# Patient Record
Sex: Male | Born: 1940 | Race: White | Hispanic: No | Marital: Married | Smoking: Former smoker
Health system: Southern US, Community
[De-identification: ages and names within clinical notes are randomized; demographics above are authoritative.]

## PROBLEM LIST (undated history)

## (undated) DIAGNOSIS — I10 Essential (primary) hypertension: Secondary | ICD-10-CM

## (undated) DIAGNOSIS — A159 Respiratory tuberculosis unspecified: Secondary | ICD-10-CM

## (undated) DIAGNOSIS — E119 Type 2 diabetes mellitus without complications: Secondary | ICD-10-CM

## (undated) DIAGNOSIS — H409 Unspecified glaucoma: Secondary | ICD-10-CM

## (undated) DIAGNOSIS — T7840XA Allergy, unspecified, initial encounter: Secondary | ICD-10-CM

## (undated) DIAGNOSIS — E785 Hyperlipidemia, unspecified: Secondary | ICD-10-CM

## (undated) DIAGNOSIS — C801 Malignant (primary) neoplasm, unspecified: Secondary | ICD-10-CM

## (undated) HISTORY — DX: Unspecified glaucoma: H40.9

## (undated) HISTORY — PX: SKIN CANCER EXCISION: SHX779

## (undated) HISTORY — DX: Type 2 diabetes mellitus without complications: E11.9

## (undated) HISTORY — PX: HYDROCELE EXCISION: SHX482

## (undated) HISTORY — DX: Respiratory tuberculosis unspecified: A15.9

## (undated) HISTORY — DX: Hyperlipidemia, unspecified: E78.5

## (undated) HISTORY — PX: POLYPECTOMY: SHX149

## (undated) HISTORY — DX: Malignant (primary) neoplasm, unspecified: C80.1

## (undated) HISTORY — DX: Essential (primary) hypertension: I10

## (undated) HISTORY — DX: Allergy, unspecified, initial encounter: T78.40XA

## (undated) HISTORY — PX: CATARACT EXTRACTION: SUR2

---

## 1998-05-14 ENCOUNTER — Encounter: Admission: RE | Admit: 1998-05-14 | Discharge: 1998-08-12 | Payer: Self-pay | Admitting: Internal Medicine

## 2000-04-18 ENCOUNTER — Inpatient Hospital Stay (HOSPITAL_COMMUNITY): Admission: EM | Admit: 2000-04-18 | Discharge: 2000-04-19 | Payer: Self-pay | Admitting: Internal Medicine

## 2008-07-24 ENCOUNTER — Ambulatory Visit (HOSPITAL_BASED_OUTPATIENT_CLINIC_OR_DEPARTMENT_OTHER): Admission: RE | Admit: 2008-07-24 | Discharge: 2008-07-24 | Payer: Self-pay | Admitting: Urology

## 2008-11-07 ENCOUNTER — Ambulatory Visit: Payer: Self-pay | Admitting: Internal Medicine

## 2008-11-21 ENCOUNTER — Ambulatory Visit: Payer: Self-pay | Admitting: Internal Medicine

## 2008-11-28 ENCOUNTER — Encounter: Payer: Self-pay | Admitting: Internal Medicine

## 2011-01-14 LAB — GLUCOSE, CAPILLARY
Glucose-Capillary: 126 mg/dL — ABNORMAL HIGH (ref 70–99)
Glucose-Capillary: 175 mg/dL — ABNORMAL HIGH (ref 70–99)

## 2011-02-11 NOTE — Op Note (Signed)
NAMEOSCAR, HANK NO.:  [id number]   MEDICAL RECORD NO.:  [id number]          PATIENT TYPE:  AMB   LOCATION:  [facility]                         FACILITY:  [facility]   PHYSICIAN:  Bertram Millard. Dahlstedt, M.D.DATE OF BIRTH:  1941-05-12   DATE OF PROCEDURE:  07/24/2008  DATE OF DISCHARGE:                               OPERATIVE REPORT   PREOPERATIVE DIAGNOSIS:  Symptomatic left hydrocele.   POSTOPERATIVE DIAGNOSIS:  Symptomatic left hydrocele.   SURGICAL PROCEDURES:  Left hydrocelectomy.   ANESTHESIA:  General with LMA.   COMPLICATIONS:  None.   DRAINS:  None.   BRIEF HISTORY:  This 70 year old male recently re-presented with a  symptomatic left hydrocele.  He was seen in the past.  As the hydrocele  was not that large or symptomatic, I recommended that we follow him  conservatively.  It has become more uncomfortable with time, and the  patient desires surgical excision.  We did discuss a temporizing  aspiration, but I discussed the fact that almost always the fluid  reaccumulates shortly after its aspiration.  He was instructed in the  surgery as well as risks and complications.  He desires to proceed.   DESCRIPTION OF PROCEDURE:  The patient was administered preoperative IV  antibiotics.  He was identified in the holding area and the site of  surgery was carefully marked.  He was taken to the operating room where  general anesthetic was administered using the LMA.  He was placed in the  recumbent position.  Genitalia and perineum were prepped and draped.   A 4-cm  incision was made in the anterior mid median raphe of the  scrotum.  Careful dissection carried [place] down to the tunica vaginalis  which was circumferentially dissected and was pushed through the  incision.  Small bleeders in the scrotum and along the tunica were  carefully electrocoagulated.  The tunica vaginalis was opened in the  midline, and the hydrocele fluid was aspirated.  The entire anterior  edge of the tunica vaginalis was opened.  It was then imbricated  posteriorly with a running 3-0 chromic.  Edges of the cut tunica were  carefully electrocoagulated.  Once the imbrication posteriorly was  complete, the cord was blocked with approximately 5 mL of quarter  percent plain Marcaine.  The inner surface of the scrotum was carefully  inspected and found to be hemostatic.  The testicle and cord were  replaced within this.   The dartos fascia was reapproximated using a running 3-0 chromic.  Skin  edges were approximated using a running 4-0 Vicryl placed in a simple  fashion.  Dry sterile dressings were placed underneath a jock strap.  The patient was awakened and then taken to PACU in stable condition.  He  tolerated procedure well.      Bertram Millard. Dahlstedt, M.D.  Electronically Signed     SMD/MEDQ  D:  07/24/2008  T:  07/24/2008  Job:  [id number]   cc:   Larina Earthly, M.D.  Fax: [phone]

## 2011-02-14 NOTE — Discharge Summary (Signed)
[place]  Patient:    Chad Oneill, Chad Oneill                  MRN: [id number] Adm. Date:  [id number] Disc. Date: [id number] Attending:  Chilton Greathouse R                           Discharge Summary  FINAL DIAGNOSES: 1. Anaphylactic shock. 2. Diabetes mellitus, type 2. 3. Hypertension.  HISTORY OF PRESENT ILLNESS:  Chad Oneill is a 70 year old white male with diabetes admitted after being stung by yellow jackets. After the stings, he became light headed and then had what appeared to a neighbor to be seizure like activity. When EMS arrived, his blood pressure was 50/palpable and his blood pressure was 59 mg per dl.  In the field, he received epinephrine and IV fluids. In the emergency room, he got additional Benadryl as well as Pepcid, as well as Solu-Medrol IV.  By the time, I saw him, he was looking fairly good.  HOSPITAL COURSE:  The patient was admitted and observed overnight.  Blood pressure increased to on the day of discharge 144/99. He had some high blood sugars after the Solu-Medrol but on the morning of discharge his blood pressure was 173.  He felt well enough to be discharged at this time.  CONDITION ON DISCHARGE:  Improved.  DISCHARGE INSTRUCTIONS:  The patient was to go home on his usual medications of aspirin, Cardizem CD, Hydrochlorothiazide/Triameterne.  I told him to stop his Accupril for the moment, to restart some self monitoring, and then reinitiate the Accupril if his blood pressures are consistently greater than 130.  Provided him with a prescription for EpiPen to use in emergencies for similar stings.  Also provided him a prescription for Prednisone 10 mg three per day times two days, two per day times two days, one per day times two days and then to stop.  Activity was as tolerated. He was instructed to avoid sweets and told that the prednisone may increase his blood sugars so he should try to eat less than usual. DD:   04/19/00 TD:  04/20/00 Job: [id number]

## 2011-07-01 LAB — POCT I-STAT 4, (NA,K, GLUC, HGB,HCT)
Potassium: 3.8
Sodium: 139

## 2011-07-01 LAB — GLUCOSE, CAPILLARY: Glucose-Capillary: 129 — ABNORMAL HIGH

## 2013-02-15 ENCOUNTER — Other Ambulatory Visit: Payer: Self-pay | Admitting: Surgery

## 2013-11-07 ENCOUNTER — Encounter: Payer: Self-pay | Admitting: Internal Medicine

## 2013-12-08 ENCOUNTER — Encounter: Payer: Self-pay | Admitting: Internal Medicine

## 2014-01-17 ENCOUNTER — Ambulatory Visit (AMBULATORY_SURGERY_CENTER): Payer: Self-pay | Admitting: *Deleted

## 2014-01-17 VITALS — Ht 73.0 in | Wt 270.6 lb

## 2014-01-17 DIAGNOSIS — Z860100 Personal history of colon polyps, unspecified: Secondary | ICD-10-CM

## 2014-01-17 MED ORDER — MOVIPREP 100 G PO SOLR: ORAL | Status: DC

## 2014-01-17 NOTE — Progress Notes (Signed)
No egg or soy allergy  No home oxygen use or problems with anesthesia  No medications for weight loss taken  emmi information given 

## 2014-01-20 ENCOUNTER — Encounter: Payer: Self-pay | Admitting: Internal Medicine

## 2014-01-31 ENCOUNTER — Ambulatory Visit (AMBULATORY_SURGERY_CENTER): Payer: Medicare Other | Admitting: Internal Medicine

## 2014-01-31 ENCOUNTER — Encounter: Payer: Self-pay | Admitting: Internal Medicine

## 2014-01-31 VITALS — BP 134/96 | HR 63 | Temp 96.8°F | Resp 11 | Ht 73.0 in | Wt 270.0 lb

## 2014-01-31 DIAGNOSIS — Z860100 Personal history of colon polyps, unspecified: Secondary | ICD-10-CM

## 2014-01-31 DIAGNOSIS — D126 Benign neoplasm of colon, unspecified: Secondary | ICD-10-CM

## 2014-01-31 HISTORY — PX: COLONOSCOPY: SHX174

## 2014-01-31 LAB — GLUCOSE, CAPILLARY
GLUCOSE-CAPILLARY: 144 mg/dL — AB (ref 70–99)
Glucose-Capillary: 125 mg/dL — ABNORMAL HIGH (ref 70–99)

## 2014-01-31 MED ORDER — SODIUM CHLORIDE 0.9 % IV SOLN: 500.0000 mL | INTRAVENOUS | Status: DC

## 2014-01-31 NOTE — Progress Notes (Signed)
Report to pacu rn, vss, bbs=clear 

## 2014-01-31 NOTE — Patient Instructions (Signed)
YOU HAD AN ENDOSCOPIC PROCEDURE TODAY AT THE [facility]: Refer to the procedure report that was given to you for any specific questions about what was found during the examination.  If the procedure report does not answer your questions, please call your gastroenterologist to clarify.  If you requested that your care partner not be given the details of your procedure findings, then the procedure report has been included in a sealed envelope for you to review at your convenience later.  YOU SHOULD EXPECT: Some feelings of bloating in the abdomen. Passage of more gas than usual.  Walking can help get rid of the air that was put into your GI tract during the procedure and reduce the bloating. If you had a lower endoscopy (such as a colonoscopy or flexible sigmoidoscopy) you may notice spotting of blood in your stool or on the toilet paper. If you underwent a bowel prep for your procedure, then you may not have a normal bowel movement for a few days.  DIET: Your first meal following the procedure should be a light meal and then it is ok to progress to your normal diet.  A half-sandwich or bowl of soup is an example of a good first meal.  Heavy or fried foods are harder to digest and may make you feel nauseous or bloated.  Likewise meals heavy in dairy and vegetables can cause extra gas to form and this can also increase the bloating.  Drink plenty of fluids but you should avoid alcoholic beverages for 24 hours.  ACTIVITY: Your care partner should take you home directly after the procedure.  You should plan to take it easy, moving slowly for the rest of the day.  You can resume normal activity the day after the procedure however you should NOT DRIVE or use heavy machinery for 24 hours (because of the sedation medicines used during the test).    SYMPTOMS TO REPORT IMMEDIATELY: A gastroenterologist can be reached at any hour.  During normal business hours, 8:30 AM to 5:00 PM Monday through Friday,  call [phone].  After hours and on weekends, please call the GI answering service at [phone] who will take a message and have the physician on call contact you.   Following lower endoscopy (colonoscopy or flexible sigmoidoscopy):  Excessive amounts of blood in the stool  Significant tenderness or worsening of abdominal pains  Swelling of the abdomen that is new, acute  Fever of 100F or higher    FOLLOW UP: If any biopsies were taken you will be contacted by phone or by letter within the next 1-3 weeks.  Call your gastroenterologist if you have not heard about the biopsies in 3 weeks.  Our staff will call the home number listed on your records the next business day following your procedure to check on you and address any questions or concerns that you may have at that time regarding the information given to you following your procedure. This is a courtesy call and so if there is no answer at the home number and we have not heard from you through the emergency physician on call, we will assume that you have returned to your regular daily activities without incident.  SIGNATURES/CONFIDENTIALITY: You and/or your care partner have signed paperwork which will be entered into your electronic medical record.  These signatures attest to the fact that that the information above on your After Visit Summary has been reviewed and is understood.  Full responsibility of the confidentiality   of this discharge information lies with you and/or your care-partner.     

## 2014-01-31 NOTE — Op Note (Signed)
[place]  [employer]. [place], [address]   COLONOSCOPY PROCEDURE REPORT  PATIENT: Chad Oneill, Chad Oneill  MR#: [id number] BIRTHDATE: 05-31-1941 , 62  yrs. old GENDER: Male ENDOSCOPIST: Eustace Quail, MD REFERRED KD:[id number] Program Recall PROCEDURE DATE:  01/31/2014 PROCEDURE:   Colonoscopy with snare polypectomy x 4 First Screening Colonoscopy - Avg.  risk and is 50 yrs.  old or older - No.  Prior Negative Screening - Now for repeat screening. N/A  History of Adenoma - Now for follow-up colonoscopy & has been > or = to 3 yrs.  Yes hx of adenoma.  Has been 3 or more years since last colonoscopy.  Polyps Removed Today? Yes. ASA CLASS:   Class II INDICATIONS:Patient's personal history of adenomatous colon polyps. Index 2010 with several < 1cm adenomas MEDICATIONS: MAC sedation, administered by CRNA and propofol (Diprivan) 250mg  IV  DESCRIPTION OF PROCEDURE:   After the risks benefits and alternatives of the procedure were thoroughly explained, informed consent was obtained.  A digital rectal exam revealed no abnormalities of the rectum.   The LB [id number]-[id number]  endoscope was introduced through the anus and advanced to the cecum, which was identified by both the appendix and ileocecal valve. No adverse events experienced.   The quality of the prep was good, using MoviPrep  The instrument was then slowly withdrawn as the colon was fully examined.  COLON FINDINGS: Four polyps ranging between 3-49mm in size were found in the transverse and sigmoid colon.  A polypectomy was performed with a cold snare.  The resection was complete and the polyps were completely retrieved.   Severe diverticulosis was noted The finding was in the left colon.   The colon mucosa was otherwise normal. Retroflexed views revealed internal hemorrhoids. The time to cecum=2 minutes 19 seconds.  Withdrawal time=13 minutes 27 seconds. The scope was withdrawn and the procedure  completed. COMPLICATIONS: There were no complications.  ENDOSCOPIC IMPRESSION: 1.   Four polyps  were found in the transverse colon and sigmoid colon; polypectomy was performed with a cold snare 2.   Severe diverticulosis was noted in the left colon 3.   The colon mucosa was otherwise normal  RECOMMENDATIONS: 1. Follow up colonoscopy in 5 years   eSigned:  Eustace Quail, MD 01/31/2014 9:20 AM   cc: Prince Solian, MD and The Patient

## 2014-01-31 NOTE — Progress Notes (Signed)
Called to room to assist during endoscopic procedure.  Patient ID and intended procedure confirmed with present staff. Received instructions for my participation in the procedure from the performing physician.  

## 2014-02-01 ENCOUNTER — Telehealth: Payer: Self-pay | Admitting: *Deleted

## 2014-02-01 NOTE — Telephone Encounter (Signed)
  Follow up Call-  Call back number 01/31/2014  Post procedure Call Back phone  # [phone]  Permission to leave phone message Yes     Patient questions:  Do you have a fever, pain , or abdominal swelling? no Pain Score  0 *  Have you tolerated food without any problems? yes  Have you been able to return to your normal activities? yes  Do you have any questions about your discharge instructions: Diet   no Medications  no Follow up visit  no  Do you have questions or concerns about your Care? no  Actions: * If pain score is 4 or above: No action needed, pain <4.

## 2014-02-07 ENCOUNTER — Encounter: Payer: Self-pay | Admitting: Internal Medicine

## 2014-06-07 ENCOUNTER — Other Ambulatory Visit (HOSPITAL_COMMUNITY): Payer: Self-pay | Admitting: Internal Medicine

## 2014-06-07 DIAGNOSIS — I959 Hypotension, unspecified: Secondary | ICD-10-CM

## 2014-06-08 ENCOUNTER — Ambulatory Visit (HOSPITAL_COMMUNITY): Payer: Medicare Other

## 2014-06-13 ENCOUNTER — Ambulatory Visit (HOSPITAL_COMMUNITY): Payer: Medicare Other

## 2014-06-14 ENCOUNTER — Ambulatory Visit (HOSPITAL_COMMUNITY)
Admission: RE | Admit: 2014-06-14 | Discharge: 2014-06-14 | Disposition: A | Discharge status: Home / Self Care | Payer: Medicare Other | Source: Ambulatory Visit | Attending: Cardiovascular Disease | Admitting: Cardiovascular Disease

## 2014-06-14 DIAGNOSIS — I369 Nonrheumatic tricuspid valve disorder, unspecified: Secondary | ICD-10-CM

## 2014-06-14 DIAGNOSIS — E119 Type 2 diabetes mellitus without complications: Secondary | ICD-10-CM | POA: Insufficient documentation

## 2014-06-14 DIAGNOSIS — I959 Hypotension, unspecified: Secondary | ICD-10-CM

## 2014-06-14 DIAGNOSIS — I951 Orthostatic hypotension: Secondary | ICD-10-CM | POA: Insufficient documentation

## 2014-06-14 NOTE — Progress Notes (Signed)
2D Echo Performed 06/14/2014    Chad Oneill, RCS

## 2015-08-15 ENCOUNTER — Encounter: Payer: Self-pay | Admitting: Internal Medicine

## 2017-08-31 ENCOUNTER — Emergency Department (HOSPITAL_COMMUNITY)
Admission: EM | Admit: 2017-08-31 | Discharge: 2017-08-31 | Disposition: A | Discharge status: Home / Self Care | Payer: Medicare Other | Attending: Emergency Medicine | Admitting: Emergency Medicine

## 2017-08-31 ENCOUNTER — Emergency Department (HOSPITAL_COMMUNITY): Payer: Medicare Other

## 2017-08-31 ENCOUNTER — Other Ambulatory Visit: Payer: Self-pay

## 2017-08-31 ENCOUNTER — Encounter (HOSPITAL_COMMUNITY): Payer: Self-pay | Admitting: Emergency Medicine

## 2017-08-31 DIAGNOSIS — R072 Precordial pain: Secondary | ICD-10-CM | POA: Insufficient documentation

## 2017-08-31 DIAGNOSIS — R079 Chest pain, unspecified: Secondary | ICD-10-CM

## 2017-08-31 DIAGNOSIS — E119 Type 2 diabetes mellitus without complications: Secondary | ICD-10-CM | POA: Diagnosis not present

## 2017-08-31 DIAGNOSIS — F17210 Nicotine dependence, cigarettes, uncomplicated: Secondary | ICD-10-CM | POA: Diagnosis not present

## 2017-08-31 DIAGNOSIS — Z7984 Long term (current) use of oral hypoglycemic drugs: Secondary | ICD-10-CM | POA: Diagnosis not present

## 2017-08-31 DIAGNOSIS — I10 Essential (primary) hypertension: Secondary | ICD-10-CM | POA: Insufficient documentation

## 2017-08-31 DIAGNOSIS — Z79899 Other long term (current) drug therapy: Secondary | ICD-10-CM | POA: Diagnosis not present

## 2017-08-31 DIAGNOSIS — Z7982 Long term (current) use of aspirin: Secondary | ICD-10-CM | POA: Diagnosis not present

## 2017-08-31 LAB — I-STAT TROPONIN, ED
TROPONIN I, POC: 0 ng/mL (ref 0.00–0.08)
Troponin i, poc: 0 ng/mL (ref 0.00–0.08)

## 2017-08-31 LAB — BASIC METABOLIC PANEL
ANION GAP: 10 (ref 5–15)
BUN: 9 mg/dL (ref 6–20)
CALCIUM: 8.8 mg/dL — AB (ref 8.9–10.3)
CHLORIDE: 101 mmol/L (ref 101–111)
CO2: 27 mmol/L (ref 22–32)
Creatinine, Ser: 0.84 mg/dL (ref 0.61–1.24)
GFR calc non Af Amer: >60 mL/min (ref >60)
GLUCOSE: 150 mg/dL — AB (ref 65–99)
POTASSIUM: 3.7 mmol/L (ref 3.5–5.1)
Sodium: 138 mmol/L (ref 135–145)

## 2017-08-31 LAB — CBC
HCT: 41 % (ref 39.0–52.0)
Hemoglobin: 13.8 g/dL (ref 13.0–17.0)
MCH: 31.4 pg (ref 26.0–34.0)
MCHC: 33.7 g/dL (ref 30.0–36.0)
MCV: 93.4 fL (ref 78.0–100.0)
PLATELETS: 225 10*3/uL (ref 150–400)
RBC: 4.39 MIL/uL (ref 4.22–5.81)
RDW: 12.9 % (ref 11.5–15.5)
WBC: 12.9 10*3/uL — AB (ref 4.0–10.5)

## 2017-08-31 MED ORDER — GI COCKTAIL ~~LOC~~
30.0000 mL | Freq: Once | ORAL | Status: AC
  Administered 2017-08-31: 30 mL via ORAL
  Filled 2017-08-31: qty 30

## 2017-08-31 NOTE — ED Notes (Signed)
Patient transported to X-ray 

## 2017-08-31 NOTE — ED Notes (Signed)
Patient and family given update. No complaints at this time.

## 2017-08-31 NOTE — ED Provider Notes (Signed)
[facility] EMERGENCY DEPARTMENT Provider Note   CSN: [id number] Arrival date & time: 08/31/17  0749     History   Chief Complaint Chief Complaint  Patient presents with  . Chest Pain    HPI Chad Oneill is a 76 y.o. male who presents with chest pain. PMH significant for non-insulin dependent DM, HTN, HLD, A.fib (not on blood thinners), glaucoma. The patient states he was woken up acutely at ~4 AM this morning due to chest pain. He states it felt like heartburn so he drank baking soda and water. He states he has had heartburn in the past and it felt similar to this and he ate chinese take out last night. This helped the chest pain but he still had some residual discomfort over the substernal area. He states it feels like a cramping. He denies fever, chills, cough, SOB, wheezing, abdominal pain, N/V, diaphoresis. He took 2 ASA and received 1 nitro by EMS which did not help the pain. He denies cardiac history. He has had an echo in 2015 which showed mild LVH, grade 1 diastolic dysfunction with a normal EF. No recent surgery/travel/immobilization, leg swelling, hemptysis, prior DVT/PE, or hormone use. He does have a hx of melanoma but this is not currently active.  PCP: [facility]  HPI  Past Medical History:  Diagnosis Date  . Allergy   . Cancer ([place])    melanoma- back  . Diabetes mellitus without complication ([place])   . Glaucoma   . Hyperlipidemia   . Hypertension   . 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   Past Surgical History:  Procedure Laterality Date  . CATARACT EXTRACTION     both eyes  . HYDROCELE EXCISION    . SKIN CANCER EXCISION         Home Medications    Prior to Admission medications   Medication Sig Start Date End Date Taking? Authorizing Provider  aspirin EC 81 MG tablet Take 81 mg by mouth daily.    [provider]  atorvastatin  (LIPITOR) 10 MG tablet Take 10 mg by mouth daily.    [provider]  bimatoprost (LUMIGAN) 0.01 % SOLN Place 1 drop into both eyes at bedtime.    [provider]  diltiazem (TIAZAC) 300 MG 24 hr capsule Take 300 mg by mouth daily.    [provider]  doxazosin (CARDURA) 4 MG tablet Take 4 mg by mouth daily.    [provider]  exenatide (BYETTA) 10 MCG/0.04ML SOPN injection Inject 10 mcg into the skin 2 (two) times daily with a meal.    [provider]  lisinopril (PRINIVIL,ZESTRIL) 20 MG tablet Take 20 mg by mouth daily.    [provider]  metFORMIN (GLUMETZA) 1000 MG (MOD) 24 hr tablet Take 1,000 mg by mouth daily with breakfast. Takes 2 tablets daily    [provider]    Family History Family History  Problem Relation Age of Onset  . Colon cancer Neg Hx   . Esophageal cancer Neg Hx   . Rectal cancer Neg Hx   . Stomach cancer Neg Hx     Social History Social History   Tobacco Use  . Smoking status: Former Research scientist (life sciences)  . Smokeless tobacco: Former Systems developer  . Tobacco comment: quit smoking 40 years ago  Substance Use Topics  . Alcohol use: Yes  Comment: "a couple of drinks a day"  . Drug use: No     Allergies   Mercury and Other   Review of Systems Review of Systems  Constitutional: Negative for diaphoresis and fever.  Respiratory: Negative for cough and shortness of breath.   Cardiovascular: Positive for chest pain. Negative for leg swelling.  Gastrointestinal: Negative for abdominal pain, nausea and vomiting.  Neurological: Negative for syncope and light-headedness.  All other systems reviewed and are negative.    Physical Exam Updated Vital Signs BP 140/85 (BP Location: Right Arm)   Pulse 89   Temp 98.1 F (36.7 C) (Oral)   Resp (!) 22   Ht 6\' 2"  (1.88 m)   Wt 115.7 kg (255 lb)   SpO2 93%   BMI 32.74 kg/m   Physical Exam  Constitutional: He is oriented to person, place, and time. He appears  well-developed and well-nourished. No distress.  HENT:  Head: Normocephalic and atraumatic.  Eyes: Conjunctivae are normal. Pupils are equal, round, and reactive to light. Right eye exhibits no discharge. Left eye exhibits no discharge. No scleral icterus.  Neck: Normal range of motion.  Cardiovascular: Normal rate and regular rhythm. Exam reveals no gallop and no friction rub.  No murmur heard. Pulmonary/Chest: Effort normal and breath sounds normal. No stridor. No respiratory distress. He has no wheezes. He has no rales. He exhibits no tenderness.  Abdominal: Soft. Bowel sounds are normal. He exhibits no distension. There is no tenderness.  Musculoskeletal:  No leg swelling  Neurological: He is alert and oriented to person, place, and time.  Skin: Skin is warm and dry.  Psychiatric: He has a normal mood and affect. His behavior is normal.  Nursing note and vitals reviewed.    ED Treatments / Results  Labs (all labs ordered are listed, but only abnormal results are displayed) Labs Reviewed  BASIC METABOLIC PANEL - Abnormal; Notable for the following components:      Result Value   Glucose, Bld 150 (*)    Calcium 8.8 (*)    All other components within normal limits  CBC - Abnormal; Notable for the following components:   WBC 12.9 (*)    All other components within normal limits  I-STAT TROPONIN, ED  I-STAT TROPONIN, ED    EKG  EKG Interpretation  Date/Time:  Monday August 31 2017 07:50:26 EST Ventricular Rate:  94 PR Interval:  190 QRS Duration: 94 QT Interval:  382 QTC Calculation: 477 R Axis:   -22 Text Interpretation:  Sinus rhythm with Premature atrial complexes with Abberant conduction Nonspecific T wave abnormality Prolonged QT Abnormal ECG No significant change since last tracing Confirmed by Deno Etienne [phone]) on 08/31/2017 7:55:35 AM Also confirmed by Deno Etienne [phone]), editor Philomena Doheny [phone])  on 08/31/2017 9:08:55 AM       Radiology Dg Chest 2  View  Result Date: 08/31/2017 CLINICAL DATA:  Chest pain for several hours EXAM: CHEST  2 VIEW COMPARISON:  None. FINDINGS: Cardiac shadow is within normal limits. The lungs are well aerated bilaterally. No focal infiltrate or sizable effusion is seen. Degenerative changes of thoracic spine are noted. No acute bony abnormality is seen. IMPRESSION: No active cardiopulmonary disease. Electronically Signed   By: Inez Catalina M.D.   On: 08/31/2017 08:42    Procedures Procedures (including critical care time)  Medications Ordered in ED Medications  gi cocktail (Maalox,Lidocaine,Donnatal) (30 mLs Oral Given 08/31/17 0900)     Initial Impression / Assessment and Plan /  ED Course  I have reviewed the triage vital signs and the nursing notes.  Pertinent labs & imaging results that were available during my care of the patient were reviewed by me and considered in my medical decision making (see chart for details).  76 year old male with chest pain since 4 AM this morning. Chest pain work up is reassuring. Doubt ACS, PE, pericarditis, esophageal rupture, tension pneumothorax, aortic dissection, cardiac tamponade. Likely GI etiology. EKG is NSR and shows no significant change since last. CXR is negative. Initial and second troponin is 0. Labs are unremarkable. Patient improved with GI cocktail. Discussed with Dr. Tyrone Nine. Will have patient follow up with PCP. Return precautions given.   Final Clinical Impressions(s) / ED Diagnoses   Final diagnoses:  Chest pain, unspecified type    ED Discharge Orders    None       Recardo Evangelist, PA-C 08/31/17 Collins, [place], DO 08/31/17 1404

## 2017-08-31 NOTE — ED Triage Notes (Signed)
Patient presents to the ED from home with complaints of substernal chest pain that woke him up about 0400. Patient reports non radiating. Patient reports pain worsens with breathing. Patient 1 nirto with EMS, and reports taking 1000mg  ASA. Patient has history of a-fib. Patient reports 1/10 with inspiration.

## 2018-12-26 IMAGING — DX DG CHEST 2V
2 series · 2 of 2 positions shown · non-contrast
Comparison: None.

CLINICAL DATA: Chest pain for several hours

EXAM:
CHEST  2 VIEW

[chest pa]
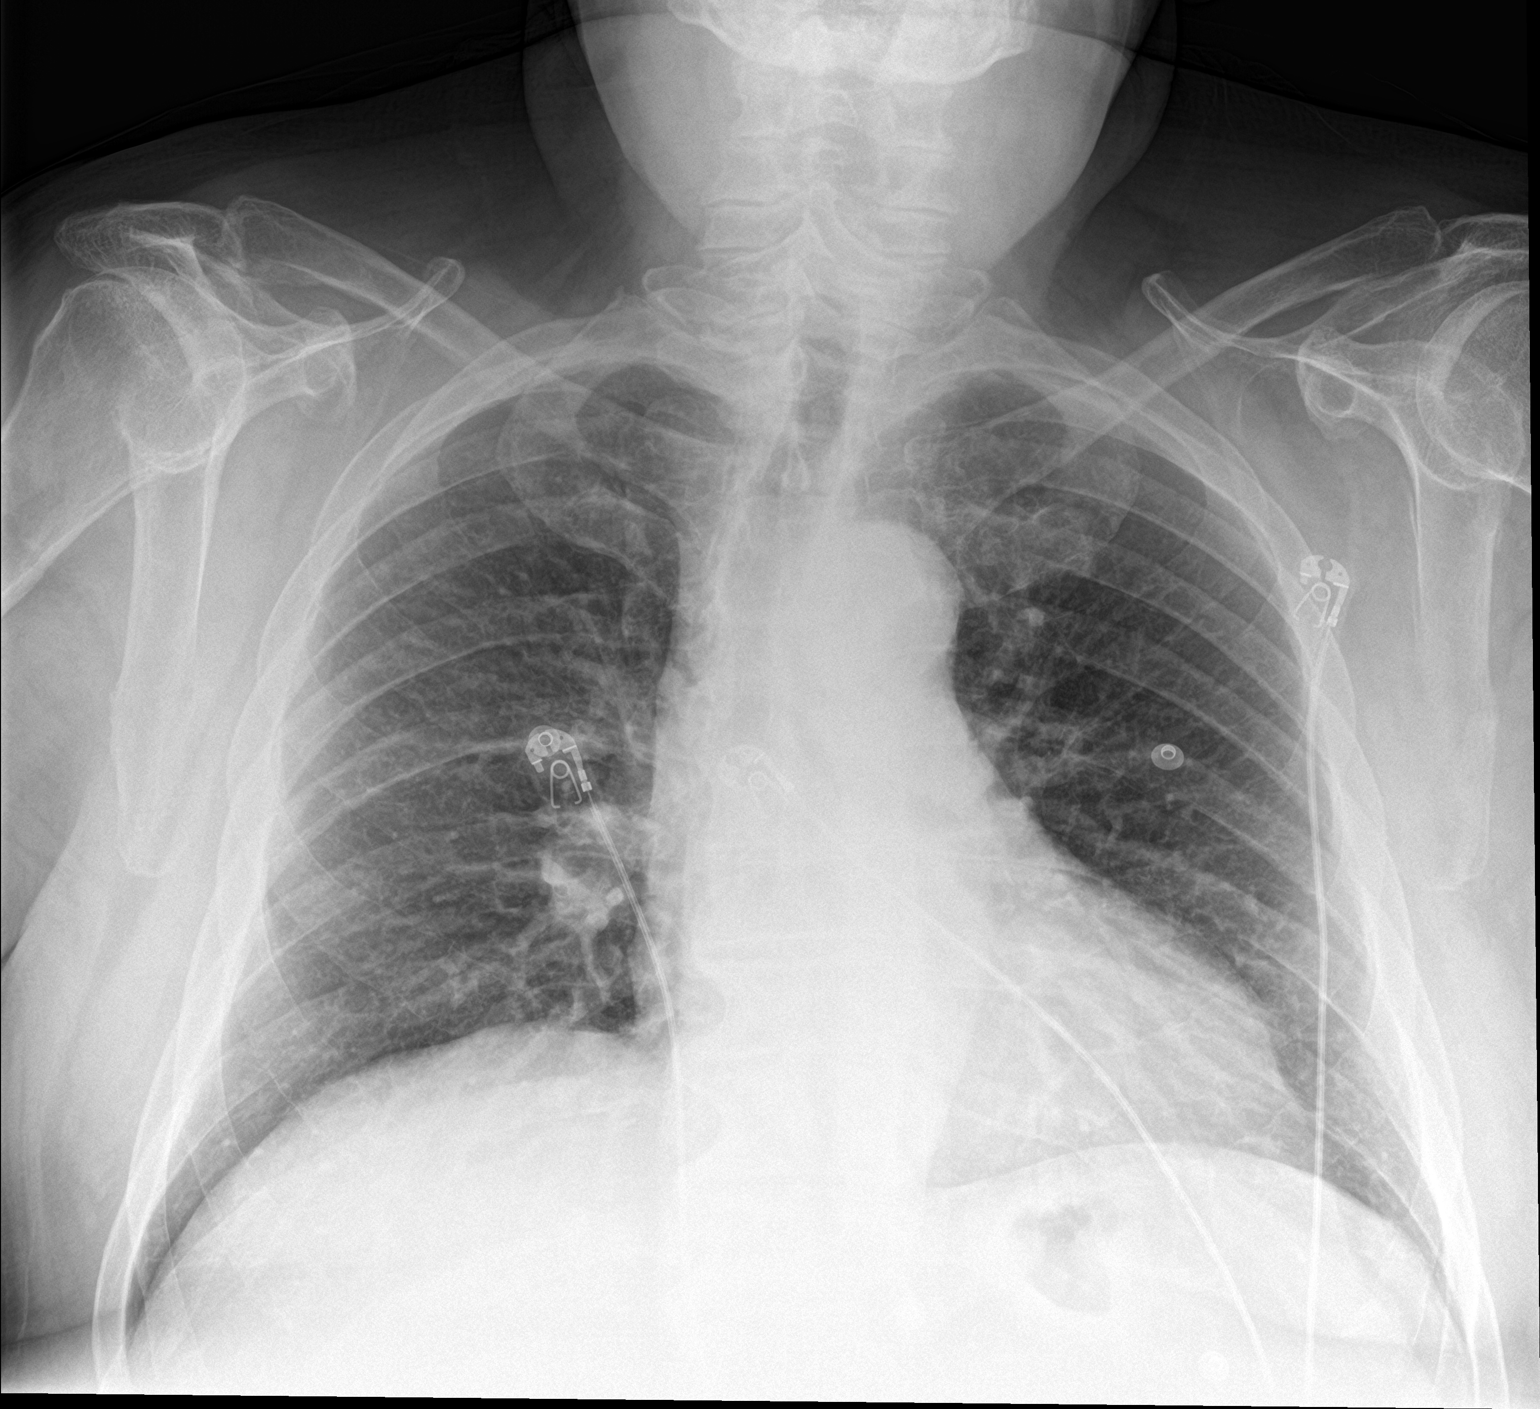

[chest lat]
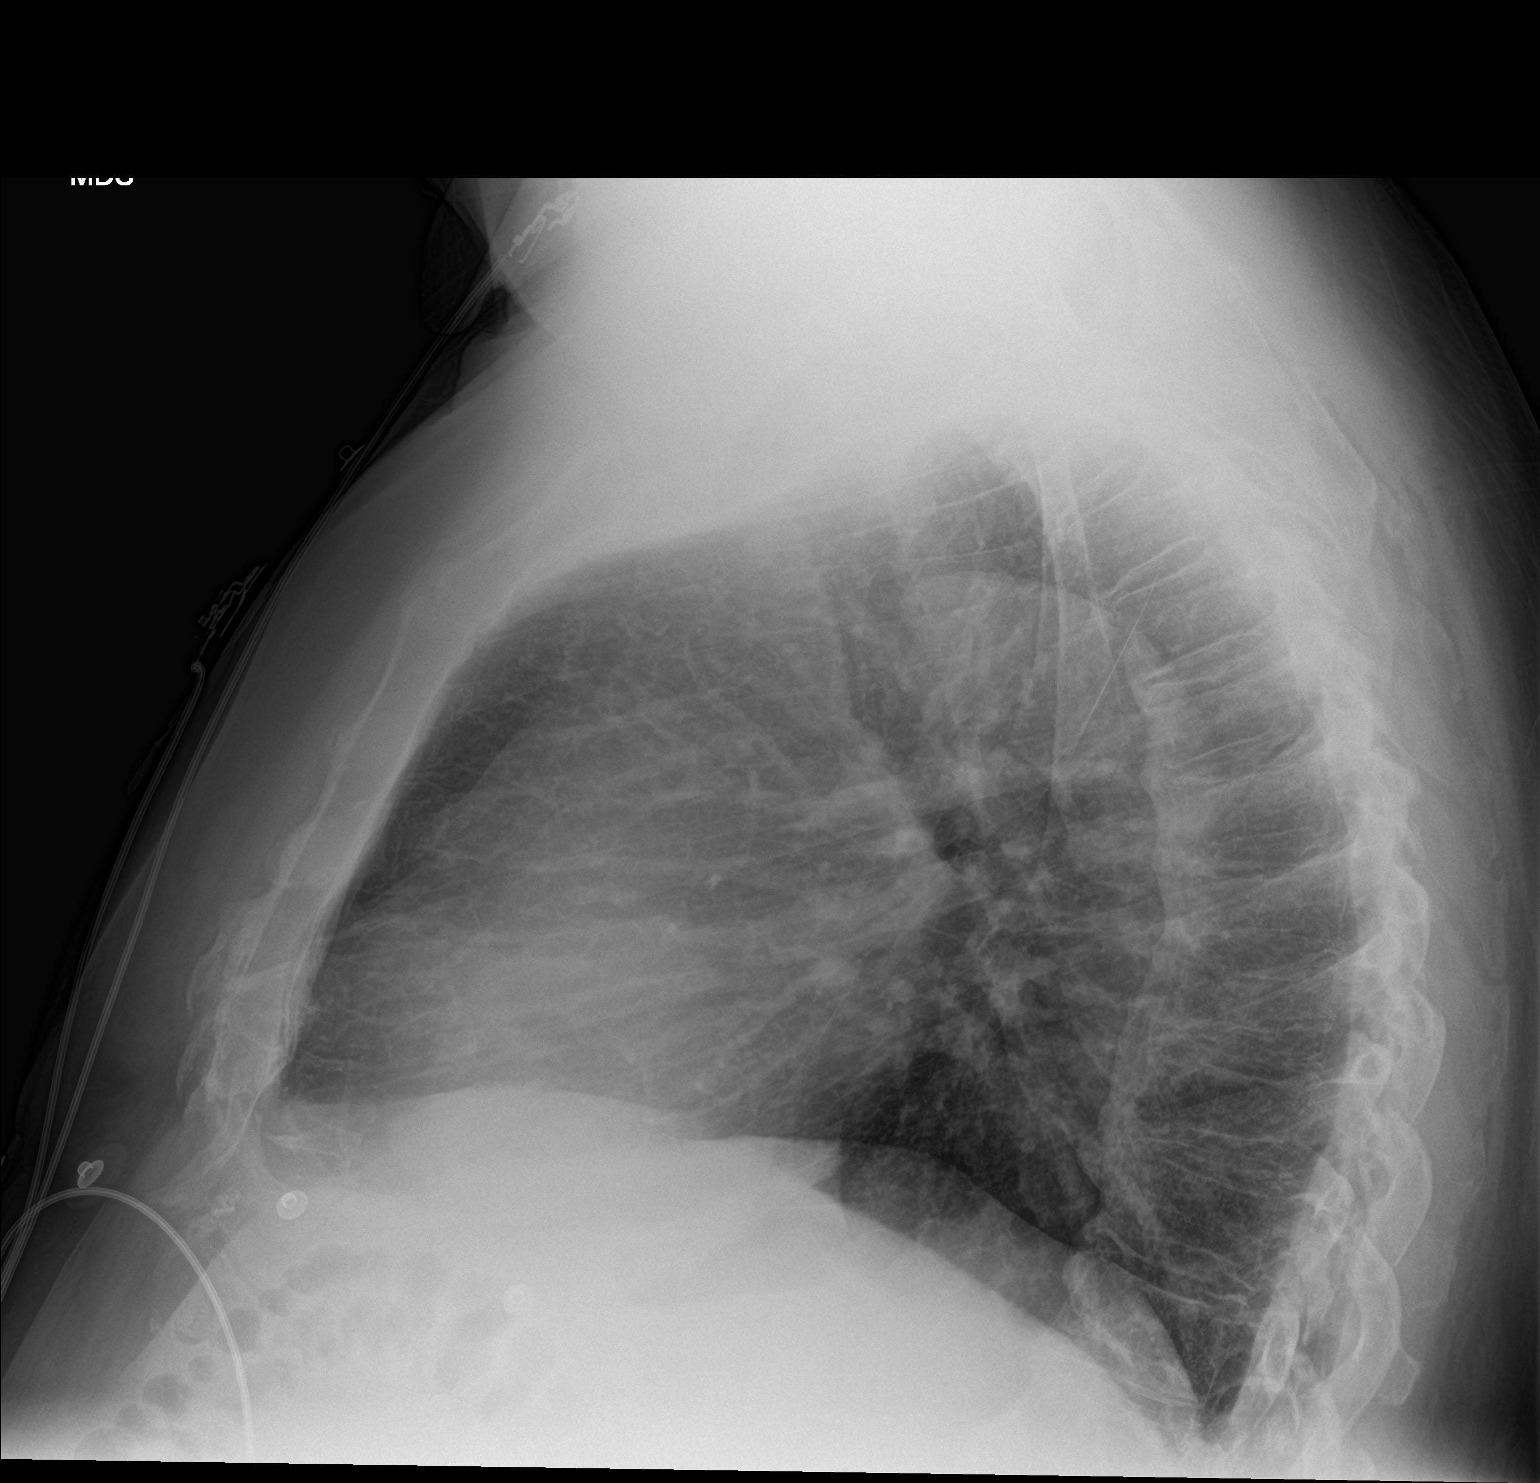

[2 of 2 positions shown; findings below may reference images not displayed]

FINDINGS: Cardiac shadow is within normal limits. The lungs are well aerated
bilaterally. No focal infiltrate or sizable effusion is seen.
Degenerative changes of thoracic spine are noted. No acute bony
abnormality is seen.
IMPRESSION: No active cardiopulmonary disease.

## 2019-03-03 ENCOUNTER — Telehealth: Payer: Self-pay

## 2019-03-03 NOTE — Telephone Encounter (Signed)
Called patient to schedule recall colonoscopy. Patient states that he wants to discuss this with his PCP in August.  He stated he will call us back after that appointment. Peter Congo

## 2019-05-30 ENCOUNTER — Encounter: Payer: Self-pay | Admitting: Internal Medicine

## 2019-06-15 ENCOUNTER — Other Ambulatory Visit: Payer: Self-pay

## 2019-06-15 ENCOUNTER — Ambulatory Visit (AMBULATORY_SURGERY_CENTER): Payer: Self-pay | Admitting: *Deleted

## 2019-06-15 VITALS — Temp 96.2°F | Ht 74.0 in | Wt 244.0 lb

## 2019-06-15 DIAGNOSIS — R195 Other fecal abnormalities: Secondary | ICD-10-CM

## 2019-06-15 DIAGNOSIS — Z86010 Personal history of colonic polyps: Secondary | ICD-10-CM

## 2019-06-15 MED ORDER — NA SULFATE-K SULFATE-MG SULF 17.5-3.13-1.6 GM/177ML PO SOLN
ORAL | 0 refills | Status: DC
Start: 2019-06-15 — End: 2019-06-29

## 2019-06-15 NOTE — Progress Notes (Signed)
Patient is here in-person for PV. Patient denies any allergies to eggs or soy. Patient denies any problems with anesthesia/sedation. Patient denies any oxygen use at home. Patient denies taking any diet/weight loss medications or blood thinners. Patient is not being treated for MRSA or C-diff. EMMI education assisgned to patient on colonoscopy, this was explained and instructions given to patient.  Pt is aware that care partner will wait in the car during procedure; if they feel like they will be too hot to wait in the car; they may wait in the lobby.  We want them to wear a mask (we do not have any that we can provide them), practice social distancing, and we will check their temperatures when they get here.  I did remind patient that their care partner needs to stay in the parking lot the entire time. Pt will wear mask into building.

## 2019-06-16 ENCOUNTER — Encounter: Payer: Self-pay | Admitting: Internal Medicine

## 2019-06-28 ENCOUNTER — Telehealth: Payer: Self-pay

## 2019-06-28 NOTE — Telephone Encounter (Signed)
Covid-19 screening questions   Do you now or have you had a fever in the last 14 days?  Do you have any respiratory symptoms of shortness of breath or cough now or in the last 14 days?  Do you have any family members or close contacts with diagnosed or suspected Covid-19 in the past 14 days?  Have you been tested for Covid-19 and found to be positive?       

## 2019-06-28 NOTE — Telephone Encounter (Signed)
Patient called back and answered "NO" to all screening questions. °

## 2019-06-29 ENCOUNTER — Other Ambulatory Visit: Payer: Self-pay

## 2019-06-29 ENCOUNTER — Encounter: Payer: Self-pay | Admitting: Internal Medicine

## 2019-06-29 ENCOUNTER — Ambulatory Visit (AMBULATORY_SURGERY_CENTER): Payer: Medicare Other | Admitting: Internal Medicine

## 2019-06-29 VITALS — BP 136/97 | HR 80 | Temp 97.9°F | Resp 16 | Ht 74.0 in | Wt 244.0 lb

## 2019-06-29 DIAGNOSIS — D122 Benign neoplasm of ascending colon: Secondary | ICD-10-CM | POA: Diagnosis not present

## 2019-06-29 DIAGNOSIS — R195 Other fecal abnormalities: Secondary | ICD-10-CM

## 2019-06-29 DIAGNOSIS — Z86010 Personal history of colonic polyps: Secondary | ICD-10-CM

## 2019-06-29 DIAGNOSIS — D123 Benign neoplasm of transverse colon: Secondary | ICD-10-CM | POA: Diagnosis not present

## 2019-06-29 MED ORDER — SODIUM CHLORIDE 0.9 % IV SOLN: 500.0000 mL | Freq: Once | INTRAVENOUS | Status: DC

## 2019-06-29 NOTE — Progress Notes (Signed)
Report to PACU, RN, vss, BBS= Clear.  

## 2019-06-29 NOTE — Patient Instructions (Addendum)
Handouts given: Polyps, Diverticulosis and Hemorrhoids   YOU HAD AN ENDOSCOPIC PROCEDURE TODAY AT THE [facility]:   Refer to the procedure report that was given to you for any specific questions about what was found during the examination.  If the procedure report does not answer your questions, please call your gastroenterologist to clarify.  If you requested that your care partner not be given the details of your procedure findings, then the procedure report has been included in a sealed envelope for you to review at your convenience later.  YOU SHOULD EXPECT: Some feelings of bloating in the abdomen. Passage of more gas than usual.  Walking can help get rid of the air that was put into your GI tract during the procedure and reduce the bloating. If you had a lower endoscopy (such as a colonoscopy or flexible sigmoidoscopy) you may notice spotting of blood in your stool or on the toilet paper. If you underwent a bowel prep for your procedure, you may not have a normal bowel movement for a few days.  Please Note:  You might notice some irritation and congestion in your nose or some drainage.  This is from the oxygen used during your procedure.  There is no need for concern and it should clear up in a day or so.  SYMPTOMS TO REPORT IMMEDIATELY:   Following lower endoscopy (colonoscopy or flexible sigmoidoscopy):  Excessive amounts of blood in the stool  Significant tenderness or worsening of abdominal pains  Swelling of the abdomen that is new, acute  Fever of 100F or higher   For urgent or emergent issues, a gastroenterologist can be reached at any hour by calling [phone].   DIET:  We do recommend a small meal at first, but then you may proceed to your regular diet.  Drink plenty of fluids but you should avoid alcoholic beverages for 24 hours.  ACTIVITY:  You should plan to take it easy for the rest of today and you should NOT DRIVE or use heavy machinery until tomorrow  (because of the sedation medicines used during the test).    FOLLOW UP: Our staff will call the number listed on your records 48-72 hours following your procedure to check on you and address any questions or concerns that you may have regarding the information given to you following your procedure. If we do not reach you, we will leave a message.  We will attempt to reach you two times.  During this call, we will ask if you have developed any symptoms of COVID 19. If you develop any symptoms (ie: fever, flu-like symptoms, shortness of breath, cough etc.) before then, please call [phone].  If you test positive for Covid 19 in the 2 weeks post procedure, please call and report this information to us.    If any biopsies were taken you will be contacted by phone or by letter within the next 1-3 weeks.  Please call us at [phone] if you have not heard about the biopsies in 3 weeks.    SIGNATURES/CONFIDENTIALITY: You and/or your care partner have signed paperwork which will be entered into your electronic medical record.  These signatures attest to the fact that that the information above on your After Visit Summary has been reviewed and is understood.  Full responsibility of the confidentiality of this discharge information lies with you and/or your care-partner. 

## 2019-06-29 NOTE — Progress Notes (Signed)
Temperature- [place]  Pt's states no medical or surgical changes since previsit or office visit.  Called to room to assist during endoscopic procedure.  Patient ID and intended procedure confirmed with present staff. Received instructions for my participation in the procedure from the performing physician.

## 2019-06-29 NOTE — Op Note (Signed)
[place] Patient Name: Chad Oneill Procedure Date: 06/29/2019 8:13 AM MRN: [id number] Endoscopist: Docia Chuck. Henrene Pastor , MD Age: 78 Referring MD:  Date of Birth: April 08, 1941 Gender: Male Account #: [id number] Procedure:                Colonoscopy with cold snare polypectomy x 2 Indications:              High risk colon cancer surveillance: Personal                            history of multiple (3 or more) adenomas. Also                            Hemoccult positive stool on outpatient testing                            August 2020. Previous examinations 2010 and 2015 Medicines:                Monitored Anesthesia Care Procedure:                Pre-Anesthesia Assessment:                           - Prior to the procedure, a History and Physical                            was performed, and patient medications and                            allergies were reviewed. The patient's tolerance of                            previous anesthesia was also reviewed. The risks                            and benefits of the procedure and the sedation                            options and risks were discussed with the patient.                            All questions were answered, and informed consent                            was obtained. Prior Anticoagulants: The patient has                            taken no previous anticoagulant or antiplatelet                            agents. ASA Grade Assessment: II - A patient with                            mild systemic disease. After reviewing the risks  and benefits, the patient was deemed in                            satisfactory condition to undergo the procedure.                           After obtaining informed consent, the colonoscope                            was passed under direct vision. Throughout the                            procedure, the patient's blood pressure, pulse, and       oxygen saturations were monitored continuously. The                            LOANER 0255 was introduced through the anus and                            advanced to the the cecum, identified by                            appendiceal orifice and ileocecal valve. The                            ileocecal valve, appendiceal orifice, and rectum                            were photographed. The quality of the bowel                            preparation was excellent. The colonoscopy was                            performed without difficulty. The patient tolerated                            the procedure well. The bowel preparation used was                            SUPREP via split dose instruction. Scope In: 8:20:37 AM Scope Out: 8:38:14 AM Scope Withdrawal Time: 0 hours 13 minutes 22 seconds  Total Procedure Duration: 0 hours 17 minutes 37 seconds  Findings:                 Two polyps were found in the transverse colon and                            ascending colon. The polyps were 1 to 2 mm in size.                            These polyps were removed with a cold snare.  Resection and retrieval were complete.                           Many small and large-mouthed diverticula were found                            in the entire colon.                           Internal hemorrhoids were found during retroflexion.                           The exam was otherwise without abnormality on                            direct and retroflexion views. Complications:            No immediate complications. Estimated blood loss:                            None. Estimated Blood Loss:     Estimated blood loss: none. Impression:               - Two 1 to 2 mm polyps in the transverse colon and                            in the ascending colon, removed with a cold snare.                            Resected and retrieved.                           - Diverticulosis in the entire  examined colon.                           - Internal hemorrhoids.                           - The examination was otherwise normal on direct                            and retroflexion views. Recommendation:           - Repeat colonoscopy is not recommended for                            surveillance.                           - Patient has a contact number available for                            emergencies. The signs and symptoms of potential                            delayed complications were discussed with the  patient. Return to normal activities tomorrow.                            Written discharge instructions were provided to the                            patient.                           - Resume previous diet.                           - Continue present medications.                           - Await pathology results. Docia Chuck. Henrene Pastor, MD 06/29/2019 9:02:49 AM This report has been signed electronically.

## 2019-07-01 ENCOUNTER — Telehealth: Payer: Self-pay

## 2019-07-01 ENCOUNTER — Encounter: Payer: Self-pay | Admitting: Internal Medicine

## 2019-07-01 NOTE — Telephone Encounter (Signed)
Attempted to reach pt. With follow-up call following endoscopic procedure 06/29/2019.  LM on pt. Voice mail.  Will try to reach pt. Again later today.

## 2019-07-01 NOTE — Telephone Encounter (Signed)
  Follow up Call-  Call back number 06/29/2019  Post procedure Call Back phone  # [phone]  Permission to leave phone message Yes  Some recent data might be hidden     Patient questions:  Do you have a fever, pain , or abdominal swelling? No. Pain Score  0 *  Have you tolerated food without any problems? Yes.    Have you been able to return to your normal activities? No.  Do you have any questions about your discharge instructions: Diet   No. Medications  No. Follow up visit  No.  Do you have questions or concerns about your Care? No.  Actions: * If pain score is 4 or above: No action needed, pain <4. 1. Have you developed a fever since your procedure? no  2.   Have you had an respiratory symptoms (SOB or cough) since your procedure? no  3.   Have you tested positive for COVID 19 since your procedure no  4.   Have you had any family members/close contacts diagnosed with the COVID 19 since your procedure?  no   If yes to any of these questions please route to Joylene John, RN and Alphonsa Gin, Therapist, sports.

## 2019-10-21 ENCOUNTER — Ambulatory Visit: Payer: Medicare Other | Attending: Internal Medicine

## 2019-10-21 DIAGNOSIS — Z23 Encounter for immunization: Secondary | ICD-10-CM | POA: Insufficient documentation

## 2019-10-21 NOTE — Progress Notes (Signed)
   Covid-19 Vaccination Clinic  Name:  Chad Oneill    MRN: [id number] DOB: 1941-05-08  10/21/2019  [name] was observed post Covid-19 immunization for 15 minutes without incidence. He was provided with Vaccine Information Sheet and instruction to access the V-Safe system.   [name] was instructed to call 911 with any severe reactions post vaccine: Marland Kitchen Difficulty breathing  . Swelling of your face and throat  . A fast heartbeat  . A bad rash all over your body  . Dizziness and weakness    Immunizations Administered    Name Date Dose VIS Date Route   Pfizer COVID-19 Vaccine 10/21/2019  1:34 PM 0.3 mL 09/09/2019 Intramuscular   Manufacturer: [place]   Lot: [id number]   [place]: [id number]

## 2019-11-11 ENCOUNTER — Ambulatory Visit: Payer: Medicare Other | Attending: Internal Medicine

## 2019-11-11 DIAGNOSIS — Z23 Encounter for immunization: Secondary | ICD-10-CM

## 2019-11-11 NOTE — Progress Notes (Signed)
   Covid-19 Vaccination Clinic  Name:  Chad Oneill    MRN: [id number] DOB: Sep 20, 1941  11/11/2019  [name] was observed post Covid-19 immunization for 15 minutes without incidence. He was provided with Vaccine Information Sheet and instruction to access the V-Safe system.   [name] was instructed to call 911 with any severe reactions post vaccine: Marland Kitchen Difficulty breathing  . Swelling of your face and throat  . A fast heartbeat  . A bad rash all over your body  . Dizziness and weakness    Immunizations Administered    Name Date Dose VIS Date Route   Pfizer COVID-19 Vaccine 11/11/2019  8:17 AM 0.3 mL 09/09/2019 Intramuscular   Manufacturer: [place]   Lot: [id number]   [place]: [id number]

## 2021-06-04 ENCOUNTER — Emergency Department (HOSPITAL_COMMUNITY): Payer: Medicare Other

## 2021-06-04 ENCOUNTER — Inpatient Hospital Stay (HOSPITAL_COMMUNITY): Payer: Medicare Other

## 2021-06-04 ENCOUNTER — Inpatient Hospital Stay (HOSPITAL_COMMUNITY)
Admission: EM | Admit: 2021-06-04 | Discharge: 2021-06-07 | DRG: 418 | Disposition: A | Discharge status: Home / Self Care | Payer: Medicare Other | Attending: Internal Medicine | Admitting: Internal Medicine

## 2021-06-04 ENCOUNTER — Other Ambulatory Visit: Payer: Self-pay

## 2021-06-04 DIAGNOSIS — E785 Hyperlipidemia, unspecified: Secondary | ICD-10-CM | POA: Diagnosis present

## 2021-06-04 DIAGNOSIS — N179 Acute kidney failure, unspecified: Secondary | ICD-10-CM | POA: Diagnosis present

## 2021-06-04 DIAGNOSIS — K8043 Calculus of bile duct with acute cholecystitis with obstruction: Principal | ICD-10-CM | POA: Diagnosis present

## 2021-06-04 DIAGNOSIS — K810 Acute cholecystitis: Secondary | ICD-10-CM | POA: Diagnosis not present

## 2021-06-04 DIAGNOSIS — I10 Essential (primary) hypertension: Secondary | ICD-10-CM | POA: Diagnosis not present

## 2021-06-04 DIAGNOSIS — Z419 Encounter for procedure for purposes other than remedying health state, unspecified: Secondary | ICD-10-CM

## 2021-06-04 DIAGNOSIS — Z09 Encounter for follow-up examination after completed treatment for conditions other than malignant neoplasm: Secondary | ICD-10-CM

## 2021-06-04 DIAGNOSIS — Z20822 Contact with and (suspected) exposure to covid-19: Secondary | ICD-10-CM | POA: Diagnosis present

## 2021-06-04 DIAGNOSIS — Z7984 Long term (current) use of oral hypoglycemic drugs: Secondary | ICD-10-CM

## 2021-06-04 DIAGNOSIS — R1013 Epigastric pain: Secondary | ICD-10-CM | POA: Diagnosis not present

## 2021-06-04 DIAGNOSIS — Z87891 Personal history of nicotine dependence: Secondary | ICD-10-CM

## 2021-06-04 DIAGNOSIS — E119 Type 2 diabetes mellitus without complications: Secondary | ICD-10-CM | POA: Diagnosis present

## 2021-06-04 DIAGNOSIS — Z79899 Other long term (current) drug therapy: Secondary | ICD-10-CM

## 2021-06-04 DIAGNOSIS — Z85828 Personal history of other malignant neoplasm of skin: Secondary | ICD-10-CM

## 2021-06-04 DIAGNOSIS — Z7982 Long term (current) use of aspirin: Secondary | ICD-10-CM

## 2021-06-04 DIAGNOSIS — R109 Unspecified abdominal pain: Secondary | ICD-10-CM

## 2021-06-04 DIAGNOSIS — F419 Anxiety disorder, unspecified: Secondary | ICD-10-CM | POA: Diagnosis present

## 2021-06-04 DIAGNOSIS — E876 Hypokalemia: Secondary | ICD-10-CM | POA: Diagnosis not present

## 2021-06-04 DIAGNOSIS — Z8611 Personal history of tuberculosis: Secondary | ICD-10-CM

## 2021-06-04 DIAGNOSIS — R7401 Elevation of levels of liver transaminase levels: Secondary | ICD-10-CM

## 2021-06-04 LAB — COMPREHENSIVE METABOLIC PANEL
ALT: 467 U/L — ABNORMAL HIGH (ref 0–44)
AST: 800 U/L — ABNORMAL HIGH (ref 15–41)
Albumin: 3.2 g/dL — ABNORMAL LOW (ref 3.5–5.0)
Alkaline Phosphatase: 334 U/L — ABNORMAL HIGH (ref 38–126)
Anion gap: 18 — ABNORMAL HIGH (ref 5–15)
BUN: 19 mg/dL (ref 8–23)
CO2: 21 mmol/L — ABNORMAL LOW (ref 22–32)
Calcium: 9.3 mg/dL (ref 8.9–10.3)
Chloride: 93 mmol/L — ABNORMAL LOW (ref 98–111)
Creatinine, Ser: 1.37 mg/dL — ABNORMAL HIGH (ref 0.61–1.24)
GFR, Estimated: 52 mL/min — ABNORMAL LOW (ref >60)
Glucose, Bld: 214 mg/dL — ABNORMAL HIGH (ref 70–99)
Potassium: 3.5 mmol/L (ref 3.5–5.1)
Sodium: 132 mmol/L — ABNORMAL LOW (ref 135–145)
Total Bilirubin: 4 mg/dL — ABNORMAL HIGH (ref 0.3–1.2)
Total Protein: 7.2 g/dL (ref 6.5–8.1)

## 2021-06-04 LAB — URINALYSIS, ROUTINE W REFLEX MICROSCOPIC
Bacteria, UA: NONE SEEN
Bilirubin Urine: NEGATIVE
Glucose, UA: >=500 mg/dL — AB
Ketones, ur: 20 mg/dL — AB
Leukocytes,Ua: NEGATIVE
Nitrite: NEGATIVE
Protein, ur: 30 mg/dL — AB
Specific Gravity, Urine: 1.026 (ref 1.005–1.030)
pH: 5 (ref 5.0–8.0)

## 2021-06-04 LAB — CBG MONITORING, ED
Glucose-Capillary: 221 mg/dL — ABNORMAL HIGH (ref 70–99)
Glucose-Capillary: 179 mg/dL — ABNORMAL HIGH (ref 70–99)
Glucose-Capillary: 213 mg/dL — ABNORMAL HIGH (ref 70–99)
Glucose-Capillary: 108 mg/dL — ABNORMAL HIGH (ref 70–99)
Glucose-Capillary: 220 mg/dL — ABNORMAL HIGH (ref 70–99)

## 2021-06-04 LAB — RESP PANEL BY RT-PCR (FLU A&B, COVID) ARPGX2
Influenza A by PCR: NEGATIVE
Influenza B by PCR: NEGATIVE
SARS Coronavirus 2 by RT PCR: NEGATIVE

## 2021-06-04 LAB — HEMOGLOBIN A1C
Hgb A1c MFr Bld: 7 % — ABNORMAL HIGH (ref 4.8–5.6)
Mean Plasma Glucose: 154.2 mg/dL

## 2021-06-04 LAB — HEPATITIS PANEL, ACUTE
HCV Ab: NONREACTIVE
Hep A IgM: NONREACTIVE
Hep B C IgM: NONREACTIVE
Hepatitis B Surface Ag: NONREACTIVE

## 2021-06-04 LAB — CBC
HCT: 46.1 % (ref 39.0–52.0)
Hemoglobin: 15.7 g/dL (ref 13.0–17.0)
MCH: 32.7 pg (ref 26.0–34.0)
MCHC: 34.1 g/dL (ref 30.0–36.0)
MCV: 96 fL (ref 80.0–100.0)
Platelets: 193 10*3/uL (ref 150–400)
RBC: 4.8 MIL/uL (ref 4.22–5.81)
RDW: 13.2 % (ref 11.5–15.5)
WBC: 7.7 10*3/uL (ref 4.0–10.5)
nRBC: 0 % (ref 0.0–0.2)

## 2021-06-04 LAB — LIPASE, BLOOD: Lipase: 27 U/L (ref 11–51)

## 2021-06-04 LAB — ACETAMINOPHEN LEVEL: Acetaminophen (Tylenol), Serum: 10 ug/mL (ref 10–30)

## 2021-06-04 LAB — ETHANOL: Alcohol, Ethyl (B): <10 mg/dL (ref <10)

## 2021-06-04 MED ORDER — ONDANSETRON HCL 4 MG/2ML IJ SOLN: 4.0000 mg | Freq: Four times a day (QID) | INTRAMUSCULAR | Status: DC | PRN

## 2021-06-04 MED ORDER — ONDANSETRON HCL 4 MG/2ML IJ SOLN
4.0000 mg | Freq: Once | INTRAMUSCULAR | Status: AC
  Administered 2021-06-04: 4 mg via INTRAVENOUS
  Filled 2021-06-04: qty 2

## 2021-06-04 MED ORDER — MORPHINE SULFATE (PF) 2 MG/ML IV SOLN
2.0000 mg | INTRAVENOUS | Status: DC | PRN
  Administered 2021-06-05: 2 mg via INTRAVENOUS
  Filled 2021-06-04: qty 1

## 2021-06-04 MED ORDER — LORAZEPAM 2 MG/ML IJ SOLN
0.5000 mg | Freq: Once | INTRAMUSCULAR | Status: DC | PRN
Start: 2021-06-04 — End: 2021-06-07
  Administered 2021-06-04: 0.5 mg via INTRAVENOUS
  Filled 2021-06-04: qty 1

## 2021-06-04 MED ORDER — PIPERACILLIN-TAZOBACTAM 3.375 G IVPB
3.3750 g | Freq: Three times a day (TID) | INTRAVENOUS | Status: DC
  Administered 2021-06-04 – 2021-06-07 (×9): 3.375 g via INTRAVENOUS
  Filled 2021-06-04 (×11): qty 50

## 2021-06-04 MED ORDER — INSULIN ASPART 100 UNIT/ML IJ SOLN
0.0000 [IU] | INTRAMUSCULAR | Status: DC
  Administered 2021-06-04 (×3): 3 [IU] via SUBCUTANEOUS
  Administered 2021-06-04: 2 [IU] via SUBCUTANEOUS
  Administered 2021-06-05: 1 [IU] via SUBCUTANEOUS
  Administered 2021-06-05: 3 [IU] via SUBCUTANEOUS
  Administered 2021-06-05 – 2021-06-06 (×2): 2 [IU] via SUBCUTANEOUS
  Administered 2021-06-06: 3 [IU] via SUBCUTANEOUS
  Administered 2021-06-06: 2 [IU] via SUBCUTANEOUS
  Administered 2021-06-06: 1 [IU] via SUBCUTANEOUS
  Administered 2021-06-07: 3 [IU] via SUBCUTANEOUS
  Administered 2021-06-07: 2 [IU] via SUBCUTANEOUS
  Administered 2021-06-07: 3 [IU] via SUBCUTANEOUS
  Administered 2021-06-07: 2 [IU] via SUBCUTANEOUS

## 2021-06-04 MED ORDER — ONDANSETRON HCL 4 MG PO TABS: 4.0000 mg | ORAL_TABLET | Freq: Four times a day (QID) | ORAL | Status: DC | PRN

## 2021-06-04 MED ORDER — SODIUM CHLORIDE 0.9 % IV BOLUS
1000.0000 mL | Freq: Once | INTRAVENOUS | Status: AC
  Administered 2021-06-04: 1000 mL via INTRAVENOUS

## 2021-06-04 MED ORDER — GADOBUTROL 1 MMOL/ML IV SOLN
10.0000 mL | Freq: Once | INTRAVENOUS | Status: AC | PRN
  Administered 2021-06-04: 10 mL via INTRAVENOUS

## 2021-06-04 MED ORDER — MORPHINE SULFATE (PF) 4 MG/ML IV SOLN
4.0000 mg | Freq: Once | INTRAVENOUS | Status: AC
  Administered 2021-06-04: 4 mg via INTRAVENOUS
  Filled 2021-06-04: qty 1

## 2021-06-04 MED ORDER — IOHEXOL 350 MG/ML SOLN
100.0000 mL | Freq: Once | INTRAVENOUS | Status: AC | PRN
  Administered 2021-06-04: 100 mL via INTRAVENOUS

## 2021-06-04 MED ORDER — PIPERACILLIN-TAZOBACTAM 3.375 G IVPB 30 MIN
3.3750 g | Freq: Once | INTRAVENOUS | Status: AC
  Administered 2021-06-04: 3.375 g via INTRAVENOUS
  Filled 2021-06-04: qty 50

## 2021-06-04 MED ORDER — LACTATED RINGERS IV SOLN: INTRAVENOUS | Status: DC

## 2021-06-04 NOTE — ED Provider Notes (Signed)
[facility] EMERGENCY DEPARTMENT Provider Note   CSN: [id number] Arrival date & time: 06/04/21  0017     History Chief Complaint  Patient presents with   Abdominal Pain    Chad Oneill is a 80 y.o. male.  The history is provided by the patient and medical records.  Abdominal Pain Associated symptoms: nausea    80 year old male with history of seasonal allergies, history of melanoma, diabetes, hyperlipidemia, hypertension, glaucoma, presenting to the ED with epigastric abdominal pain.  States it started on Friday and seemed to resolve throughout the day on Saturday but returned yesterday evening.  He initially thought this was some acid reflux so took Pepto but that actually seem to make the pain worse.  He has been experiencing a little bit of pain into his back as well.  He has had some nausea but denies any vomiting.  No change in bowel habits.  He denies any fever.  He has been taking Tylenol for his pain as well.  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   Past Surgical History:  Procedure Laterality Date   CATARACT EXTRACTION     both eyes   COLONOSCOPY  01/31/2014   HYDROCELE EXCISION     POLYPECTOMY     SKIN CANCER EXCISION         Family History  Problem Relation Age of Onset   Colon cancer Neg Hx    Esophageal cancer Neg Hx    Rectal cancer Neg Hx    Stomach cancer Neg Hx    Colon polyps Neg Hx     Social History   Tobacco Use   Smoking status: Former   Smokeless tobacco: Former   Tobacco comments:    quit smoking 40 years ago  Vaping Use   Vaping Use: Never used  Substance Use Topics   Alcohol use: Yes    Alcohol/week: 12.0 standard drinks    Types: 6 Standard  drinks or equivalent, 6 Glasses of wine per week    Comment: "a couple of drinks a day"   Drug use: No    Home Medications Prior to Admission medications   Medication Sig Start Date End Date Taking? Authorizing Provider  aspirin EC 81 MG tablet Take 81 mg by mouth daily.    [provider]  atorvastatin (LIPITOR) 10 MG tablet Take 10 mg by mouth daily.    [provider]  bimatoprost (LUMIGAN) 0.01 % SOLN Place 1 drop into both eyes at bedtime.    [provider]  diltiazem (TIAZAC) 300 MG 24 hr capsule Take 300 mg by mouth daily.    [provider]  doxazosin (CARDURA) 4 MG tablet Take 2 mg by mouth daily. Patient takes 59m  ( 1/2 tablet daily)    [provider]  Dulaglutide (TRULICITY) 02.01MEO/7.1QRSOPN Inject 0.5 mLs into the skin once a week.    [provider]  JARDIANCE 10 MG TABS tablet  02/23/19   [provider]  metFORMIN (GLUMETZA) 1000 MG (MOD) 24 hr tablet Take 1,000 mg by mouth daily with breakfast. Takes 2 tablets daily  [provider]  metoprolol tartrate (LOPRESSOR) 50 MG tablet Take 25 mg by mouth 2 (two) times daily.    [provider]    Allergies    Other and Mercury  Review of Systems   Review of Systems  Gastrointestinal:  Positive for abdominal pain and nausea.  All other systems reviewed and are negative.  Physical Exam Updated Vital Signs BP (!) 157/137 (BP Location: Right Arm)   Pulse 100   Temp 98 F (36.7 C) (Oral)   Resp 15   Ht _0  (1.854 m)   Wt 111.1 kg   SpO2 92%   BMI 32.32 kg/m   Physical Exam Vitals and nursing note reviewed.  Constitutional:      Appearance: He is well-developed.  HENT:     Head: Normocephalic and atraumatic.  Eyes:     Conjunctiva/sclera: Conjunctivae normal.     Pupils: Pupils are equal, round, and reactive to light.  Cardiovascular:     Rate and Rhythm: Normal rate and regular rhythm.     Heart sounds: Normal heart sounds.   Pulmonary:     Effort: Pulmonary effort is normal.     Breath sounds: Normal breath sounds.  Abdominal:     General: Bowel sounds are normal.     Palpations: Abdomen is soft.     Tenderness: There is abdominal tenderness in the epigastric area. There is no rebound. Negative signs include McBurney's sign.  Musculoskeletal:        General: Normal range of motion.     Cervical back: Normal range of motion.  Skin:    General: Skin is warm and dry.  Neurological:     Mental Status: He is alert and oriented to person, place, and time.    ED Results / Procedures / Treatments   Labs (all labs ordered are listed, but only abnormal results are displayed) Labs Reviewed  COMPREHENSIVE METABOLIC PANEL - Abnormal; Notable for the following components:      Result Value   Sodium 132 (*)    Chloride 93 (*)    CO2 21 (*)    Glucose, Bld 214 (*)    Creatinine, Ser 1.37 (*)    Albumin 3.2 (*)    AST 800 (*)    ALT 467 (*)    Alkaline Phosphatase 334 (*)    Total Bilirubin 4.0 (*)    GFR, Estimated 52 (*)    Anion gap 18 (*)    All other components within normal limits  URINALYSIS, ROUTINE W REFLEX MICROSCOPIC - Abnormal; Notable for the following components:   Color, Urine AMBER (*)    APPearance HAZY (*)    Glucose, UA >=500 (*)    Hgb urine dipstick SMALL (*)    Ketones, ur 20 (*)    Protein, ur 30 (*)    All other components within normal limits  LIPASE, BLOOD  CBC  HEPATITIS PANEL, ACUTE  ETHANOL  ACETAMINOPHEN LEVEL    EKG None  Radiology CT ABDOMEN PELVIS W CONTRAST  Result Date: 06/04/2021 CLINICAL DATA:  80 year old male with epigastric pain. Left lower quadrant pain. Abnormal LFTs. EXAM: CT ABDOMEN AND PELVIS WITH CONTRAST TECHNIQUE: Multidetector CT imaging of the abdomen and pelvis was performed using the standard protocol following bolus administration of intravenous contrast. CONTRAST:  114m OMNIPAQUE IOHEXOL 350 MG/ML SOLN COMPARISON:  Right upper quadrant  ultrasound 0345 hours today. FINDINGS: Lower chest: Patchy dependent and peribronchial opacity in both lungs more resembles atelectasis than infection. There  is no pleural or pericardial effusion. Hepatobiliary: Cholelithiasis is evident by CT as demonstrated earlier today. But despite the fairly normal gallbladder wall thickness by earlier ultrasound there is pericholecystic inflammation demonstrated on series 3, image 33, coronal image 31, and the gallbladder wall appears indistinct. There is also inflammatory stranding in the mesentery of the adjacent transverse colon. There is subtle hyperenhancement of the liver parenchyma adjacent to the gallbladder fossa. No convincing bile duct enlargement. Elsewhere liver enhancement is normal. No free fluid. Pancreas: Negative. Spleen: Negative. Adrenals/Urinary Tract: Normal adrenal glands. Nonobstructed kidneys with symmetric renal enhancement and contrast excretion. Ureters are decompressed and normal. Negative bladder. Stomach/Bowel: Diverticulosis throughout a redundant sigmoid colon. Diverticulosis throughout the descending colon. Intermittent diverticula in the transverse colon. The transverse colon appears mildly inflamed adjacent to the gallbladder (coronal image 31). Diverticulosis also in the ascending colon. Cecum is distended with gas and stool. No other large bowel inflammation is identified. Normal retrocecal appendix on series 3, image 60. The cecum is on a lax mesentery located anteriorly. The terminal ileum is decompressed and negative. No dilated small bowel. Stomach and duodenum remain within normal limits. No free air or free fluid. Vascular/Lymphatic: Aortoiliac calcified atherosclerosis. Major arterial structures are patent. Portal venous system is patent. No lymphadenopathy. Reproductive: Negative. Other: No pelvic free fluid.  Numerous pelvic phleboliths. Musculoskeletal: Flowing endplate osteophytes resulting in interbody ankylosis in the lower  thoracic spine to L1. Lumbar facet degeneration. No acute osseous abnormality identified. IMPRESSION: 1. Cholelithiasis with Pericholecystic Inflammation highly suspicious for Acute Cholecystitis despite the earlier Ultrasound. Mild secondary inflammation of the adjacent hepatic flexure. No free fluid or other complicating features. 2. Extensive diverticulosis of the large bowel, but no other large bowel inflammation identified. Normal appendix. 3. Patchy dependent and peribronchial opacity in both lungs more resembles atelectasis than infection. No pleural effusion. 4. Aortic Atherosclerosis (ICD10-I70.0). Electronically Signed   By: Genevie Ann M.D.   On: 06/04/2021 04:27   US Abdomen Limited RUQ (LIVER/GB)  Result Date: 06/04/2021 CLINICAL DATA:  Right upper quadrant abdominal pain EXAM: ULTRASOUND ABDOMEN LIMITED RIGHT UPPER QUADRANT COMPARISON:  None. FINDINGS: Gallbladder: The gallbladder is mildly distended and contains layering sludge and small shadowing stones which appear mobile on decubitus positioning. There is no gallbladder wall thickening or pericholecystic fluid identified. The sonographic Percell Miller sign is reportedly negative. Common bile duct: Diameter: 5 mm in proximal diameter. Liver: No focal lesion identified. Within normal limits in parenchymal echogenicity. Portal vein is patent on color Doppler imaging with normal direction of blood flow towards the liver. Other: None. IMPRESSION: Cholelithiasis without sonographic evidence of acute cholecystitis. Electronically Signed   By: Fidela Salisbury M.D.   On: 06/04/2021 04:02    Procedures Procedures   Medications Ordered in ED Medications  piperacillin-tazobactam (ZOSYN) IVPB 3.375 g (3.375 g Intravenous New Bag/Given 06/04/21 0503)  morphine 4 MG/ML injection 4 mg (4 mg Intravenous Given 06/04/21 0339)  ondansetron (ZOFRAN) injection 4 mg (4 mg Intravenous Given 06/04/21 0339)  iohexol (OMNIPAQUE) 350 MG/ML injection 100 mL (100 mLs Intravenous  Contrast Given 06/04/21 0413)    ED Course  I have reviewed the triage vital signs and the nursing notes.  Pertinent labs & imaging results that were available during my care of the patient were reviewed by me and considered in my medical decision making (see chart for details).    MDM Rules/Calculators/A&P  80 year old male here with epigastric abdominal pain that began 48 hours ago.  States he was okay for the most part yesterday but pain returned today and is worse.  He is afebrile and nontoxic.  Does have tenderness in the epigastrium and also reports pain radiating through to the back.  Labs with significant elevation of LFTs, bili, and alk phos (AST 800, ALT 467, Alk phos 334, bili 4.0).  He does admit to fairly regular alcohol use but has not had any in the past 48 hours since he began having abdominal pain.  Does report taking Tylenol but does not believe he has been taking this to excess.  He is currently on a statin.  We will add on ethanol, Tylenol levels and send hepatitis panel.  Will obtain CT scan.  CT scan with findings concerning for acute cholecystitis, however right upper quadrant ultrasound read as negative.  Consider choledocholithiasis, although no visualized stone seen in CBD on scan.  Start IV zosyn, obtain covid screen.  He is currently pain free after IV morphine.  Discussed with general surgery, Dr. Zenia Resides-- agrees may be choledocholithiasis.  Requested hospital admission, GI consult in a.m.  Surgery will also provide consultation.  Discussed with hospitalist, Dr. Alcario Drought-- will admit for ongoing care. He will message GI for consultation in AM.  Final Clinical Impression(s) / ED Diagnoses Final diagnoses:  Epigastric pain  Acute cholecystitis  Transaminitis    Rx / DC Orders ED Discharge Orders     None        Larene Pickett, PA-C 06/04/21 0507    Fatima Blank, MD 06/04/21 [phone]

## 2021-06-04 NOTE — Progress Notes (Signed)
I have seen and assessed patient and agree with Dr. Juleen China assessment and plan.  Patient is a pleasant 80 year old gentleman history of type 2 diabetes, hypertension, hyperlipidemia presented to the ED with epigastric abdominal pain.  Lab work obtained with a significant transaminitis.  CT abdomen and pelvis done suspicious for an acute cholecystitis.  Right upper quadrant ultrasound done with cholelithiasis without sonographic evidence of acute cholecystitis.  Acute hepatitis panel negative.  Patient placed empirically on IV Zosyn.  Continue hydration with IV fluids General surgery consulted who ordered MRCP and recommended GI evaluation.  GI consulted.  Currently NPO.  GI and general surgery  No charge.

## 2021-06-04 NOTE — Care Management CC44 (Signed)
Condition Code 44 Documentation Completed  Patient Details  Name: Chad Oneill MRN: [id number] Date of Birth: [birth year]   Condition Code 44 given:  Yes Patient signature on Condition Code 44 notice:  Yes Documentation of 2 MD's agreement:  Yes Code 44 added to claim:  Yes    Fuller Mandril, RN 06/04/2021, 4:06 PM

## 2021-06-04 NOTE — Care Management Obs Status (Signed)
MEDICARE OBSERVATION STATUS NOTIFICATION   Patient Details  Name: Chad Oneill MRN: [id number] Date of Birth: 04/23/1941   Medicare Observation Status Notification Given:  Yes    Fuller Mandril, RN 06/04/2021, 4:06 PM

## 2021-06-04 NOTE — ED Triage Notes (Signed)
Pt c/o epigastric pain that started Friday night. Pt denies nausea or vomiting.

## 2021-06-04 NOTE — ED Notes (Signed)
Pt ambulated to the bathroom on his own power, gait even and steady

## 2021-06-04 NOTE — ED Notes (Signed)
Pt still in MRI at this time, unable to obtain CBG

## 2021-06-04 NOTE — Consult Note (Signed)
Chad Oneill Oswaldo Milian August 14, 1941  [id number].    Requesting MD: Quincy Carnes, PA-C Chief Complaint/Reason for Consult: abdominal pain, cholecystitis  HPI:  Mr. Macalister is a 80 year old male who presented to the ED with 1 day of upper abdominal pain.  His pain started yesterday in the left upper quadrant and has spread across the upper abdomen.  It was associated with nausea but no vomiting.  He has had subjective chills but no documented fevers.  Labs in the ED showed significant elevation in his LFTs.  Total bilirubin is 4.0, alk phos 334, and AST/ALT 800/467.  Lipase is normal. RUQ [place] and CT scan both confirmed cholelithiasis.  General surgery was consulted.  The patient denies any prior symptoms of biliary colic.  He has never had any prior abdominal surgeries.  ROS: Review of Systems  Constitutional:  Positive for chills. Negative for fever.  Respiratory:  Negative for shortness of breath, wheezing and stridor.   Cardiovascular:  Negative for chest pain.  Gastrointestinal:  Positive for abdominal pain and nausea. Negative for vomiting.  Neurological:  Negative for weakness.   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 He has quit using smokeless tobacco. He reports current alcohol use of about 12.0 standard drinks per week. He reports that he does not use  drugs.  Allergies:  Allergies  Allergen Reactions   Other     Insect stings- hives, difficulty breathing   Mercury Rash    REACTION: rash    (Not in a hospital admission)    Physical Exam: Blood pressure 119/79, pulse (!) 105, temperature 98 F (36.7 C), temperature source Oral, resp. rate (!) 22, height '6\' 1"'  (1.854 m), weight 111.1 kg, SpO2 96 %. General: resting comfortably, appears stated age, no apparent distress Neurological: alert and oriented, no focal deficits, cranial nerves grossly in tact HEENT: normocephalic, atraumatic, oropharynx clear, mild scleral icterus CV: extremities warm and well-perfused Respiratory: normal work of breathing on room air, symmetric chest wall expansion Abdomen: soft, nondistended, minimally tender to palpation in upper abdomen. No masses or organomegaly. Extremities: warm and well-perfused, no deformities, moving all extremities spontaneously Psychiatric: normal mood and affect Skin: warm and dry, no jaundice, no rashes or lesions   Results for orders placed or performed during the hospital encounter of 06/04/21 (from the past 48 hour(s))  Urinalysis, Routine w reflex microscopic     Status: Abnormal   Collection Time: 06/04/21 12:26 AM  Result Value Ref Range   Color, Urine AMBER (A) YELLOW    Comment: BIOCHEMICALS MAY BE AFFECTED BY COLOR   APPearance HAZY (A) CLEAR   Specific Gravity,  Urine 1.026 1.005 - 1.030   pH 5.0 5.0 - 8.0   Glucose, UA >=500 (A) NEGATIVE mg/dL   Hgb urine dipstick SMALL (A) NEGATIVE   Bilirubin Urine NEGATIVE NEGATIVE   Ketones, ur 20 (A) NEGATIVE mg/dL   Protein, ur 30 (A) NEGATIVE mg/dL   Nitrite NEGATIVE NEGATIVE   Leukocytes,Ua NEGATIVE NEGATIVE   RBC / HPF 0-5 0 - 5 RBC/hpf   WBC, UA 0-5 0 - 5 WBC/hpf   Bacteria, UA NONE SEEN NONE SEEN   Mucus PRESENT     Comment: Performed at [facility] [address] [place], [place] [address]  Lipase, blood     Status: None   Collection Time:  06/04/21 12:32 AM  Result Value Ref Range   Lipase 27 11 - 51 U/L    Comment: Performed at [facility], [place] [address], [place], [place] [address]  Comprehensive metabolic panel     Status: Abnormal   Collection Time: 06/04/21 12:32 AM  Result Value Ref Range   Sodium 132 (L) 135 - 145 mmol/L   Potassium 3.5 3.5 - 5.1 mmol/L   Chloride 93 (L) 98 - 111 mmol/L   CO2 21 (L) 22 - 32 mmol/L   Glucose, Bld 214 (H) 70 - 99 mg/dL    Comment: Glucose reference range applies only to samples taken after fasting for at least 8 hours.   BUN 19 8 - 23 mg/dL   Creatinine, Ser 1.37 (H) 0.61 - 1.24 mg/dL   Calcium 9.3 8.9 - 10.3 mg/dL   Total Protein 7.2 6.5 - 8.1 g/dL   Albumin 3.2 (L) 3.5 - 5.0 g/dL   AST 800 (H) 15 - 41 U/L   ALT 467 (H) 0 - 44 U/L   Alkaline Phosphatase 334 (H) 38 - 126 U/L   Total Bilirubin 4.0 (H) 0.3 - 1.2 mg/dL   GFR, Estimated 52 (L) >60 mL/min    Comment: (NOTE) Calculated using the CKD-EPI Creatinine Equation (2021)    Anion gap 18 (H) 5 - 15    Comment: Performed at [facility], [place] [address], [place], [place] [address]  CBC     Status: None   Collection Time: 06/04/21 12:32 AM  Result Value Ref Range   WBC 7.7 4.0 - 10.5 K/uL   RBC 4.80 4.22 - 5.81 MIL/uL   Hemoglobin 15.7 13.0 - 17.0 g/dL   HCT 46.1 39.0 - 52.0 %   MCV 96.0 80.0 - 100.0 fL   MCH 32.7 26.0 - 34.0 pg   MCHC 34.1 30.0 - 36.0 g/dL   RDW 13.2 11.5 - 15.5 %   Platelets 193 150 - 400 K/uL   nRBC 0.0 0.0 - 0.2 %    Comment: Performed at [facility], [place] [address] [place], [place] [address]  Ethanol     Status: None   Collection Time: 06/04/21  3:10 AM  Result Value Ref Range   Alcohol, Ethyl (B) <10 <10 mg/dL    Comment: (NOTE) Lowest detectable limit for serum alcohol is 10 mg/dL.  For medical purposes only. Performed at [facility], [place] [address], [place], [place] [address]   Acetaminophen level     Status: None   Collection Time: 06/04/21  3:10 AM  Result  Value Ref Range   Acetaminophen (Tylenol), Serum 10 10 - 30 ug/mL    Comment: (NOTE) Therapeutic concentrations vary significantly. A range of 10-30 ug/mL  may be  an effective concentration for many patients. However, some  are best treated at concentrations outside of this range. Acetaminophen concentrations >150 ug/mL at 4 hours after ingestion  and >50 ug/mL at 12 hours after ingestion are often associated with  toxic reactions.  Performed at [facility], [place] [address] [place], [place] [address]   Resp Panel by RT-PCR (Flu A&B, Covid) Nasopharyngeal Swab     Status: None   Collection Time: 06/04/21  4:37 AM   Specimen: Nasopharyngeal Swab; Nasopharyngeal(NP) swabs in vial transport medium  Result Value Ref Range   SARS Coronavirus 2 by RT PCR NEGATIVE NEGATIVE    Comment: (NOTE) SARS-CoV-2 target nucleic acids are NOT DETECTED.  The SARS-CoV-2 RNA is generally detectable in upper respiratory specimens during the acute phase of infection. The lowest concentration of SARS-CoV-2 viral copies this assay can detect is 138 copies/mL. A negative result does not preclude SARS-Cov-2 infection and should not be used as the sole basis for treatment or other patient management decisions. A negative result may occur with  improper specimen collection/handling, submission of specimen other than nasopharyngeal swab, presence of viral mutation(s) within the areas targeted by this assay, and inadequate number of viral copies(<138 copies/mL). A negative result must be combined with clinical observations, patient history, and epidemiological information. The expected result is Negative.  Fact Sheet for Patients:  EntrepreneurPulse.com.au  Fact Sheet for Healthcare Providers:  IncredibleEmployment.be  This test is no t yet approved or cleared by the [place] FDA and  has been authorized for detection and/or diagnosis of SARS-CoV-2 by FDA under  an Emergency Use Authorization (EUA). This EUA will remain  in effect (meaning this test can be used) for the duration of the COVID-19 declaration under Section 564(b)(1) of the Act, 21 U.S.C.section 360bbb-3(b)(1), unless the authorization is terminated  or revoked sooner.       Influenza A by PCR NEGATIVE NEGATIVE   Influenza B by PCR NEGATIVE NEGATIVE    Comment: (NOTE) The Xpert Xpress SARS-CoV-2/FLU/RSV plus assay is intended as an aid in the diagnosis of influenza from Nasopharyngeal swab specimens and should not be used as a sole basis for treatment. Nasal washings and aspirates are unacceptable for Xpert Xpress SARS-CoV-2/FLU/RSV testing.  Fact Sheet for Patients: EntrepreneurPulse.com.au  Fact Sheet for Healthcare Providers: IncredibleEmployment.be  This test is not yet approved or cleared by the [place] FDA and has been authorized for detection and/or diagnosis of SARS-CoV-2 by FDA under an Emergency Use Authorization (EUA). This EUA will remain in effect (meaning this test can be used) for the duration of the COVID-19 declaration under Section 564(b)(1) of the Act, 21 U.S.C. section 360bbb-3(b)(1), unless the authorization is terminated or revoked.  Performed at [facility], [place] [address], [place], [place] [address]   CBG monitoring, ED     Status: Abnormal   Collection Time: 06/04/21  5:41 AM  Result Value Ref Range   Glucose-Capillary 221 (H) 70 - 99 mg/dL    Comment: Glucose reference range applies only to samples taken after fasting for at least 8 hours.   CT ABDOMEN PELVIS W CONTRAST  Result Date: 06/04/2021 CLINICAL DATA:  80 year old male with epigastric pain. Left lower quadrant pain. Abnormal LFTs. EXAM: CT ABDOMEN AND PELVIS WITH CONTRAST TECHNIQUE: Multidetector CT imaging of the abdomen and pelvis was performed using the standard protocol following bolus administration of intravenous contrast. CONTRAST:   170m OMNIPAQUE IOHEXOL 350 MG/ML SOLN COMPARISON:  Right upper quadrant ultrasound 0345  hours today. FINDINGS: Lower chest: Patchy dependent and peribronchial opacity in both lungs more resembles atelectasis than infection. There is no pleural or pericardial effusion. Hepatobiliary: Cholelithiasis is evident by CT as demonstrated earlier today. But despite the fairly normal gallbladder wall thickness by earlier ultrasound there is pericholecystic inflammation demonstrated on series 3, image 33, coronal image 31, and the gallbladder wall appears indistinct. There is also inflammatory stranding in the mesentery of the adjacent transverse colon. There is subtle hyperenhancement of the liver parenchyma adjacent to the gallbladder fossa. No convincing bile duct enlargement. Elsewhere liver enhancement is normal. No free fluid. Pancreas: Negative. Spleen: Negative. Adrenals/Urinary Tract: Normal adrenal glands. Nonobstructed kidneys with symmetric renal enhancement and contrast excretion. Ureters are decompressed and normal. Negative bladder. Stomach/Bowel: Diverticulosis throughout a redundant sigmoid colon. Diverticulosis throughout the descending colon. Intermittent diverticula in the transverse colon. The transverse colon appears mildly inflamed adjacent to the gallbladder (coronal image 31). Diverticulosis also in the ascending colon. Cecum is distended with gas and stool. No other large bowel inflammation is identified. Normal retrocecal appendix on series 3, image 60. The cecum is on a lax mesentery located anteriorly. The terminal ileum is decompressed and negative. No dilated small bowel. Stomach and duodenum remain within normal limits. No free air or free fluid. Vascular/Lymphatic: Aortoiliac calcified atherosclerosis. Major arterial structures are patent. Portal venous system is patent. No lymphadenopathy. Reproductive: Negative. Other: No pelvic free fluid.  Numerous pelvic phleboliths. Musculoskeletal:  Flowing endplate osteophytes resulting in interbody ankylosis in the lower thoracic spine to L1. Lumbar facet degeneration. No acute osseous abnormality identified. IMPRESSION: 1. Cholelithiasis with Pericholecystic Inflammation highly suspicious for Acute Cholecystitis despite the earlier Ultrasound. Mild secondary inflammation of the adjacent hepatic flexure. No free fluid or other complicating features. 2. Extensive diverticulosis of the large bowel, but no other large bowel inflammation identified. Normal appendix. 3. Patchy dependent and peribronchial opacity in both lungs more resembles atelectasis than infection. No pleural effusion. 4. Aortic Atherosclerosis (ICD10-I70.0). Electronically Signed   By: Genevie Ann M.D.   On: 06/04/2021 04:27   US Abdomen Limited RUQ (LIVER/GB)  Result Date: 06/04/2021 CLINICAL DATA:  Right upper quadrant abdominal pain EXAM: ULTRASOUND ABDOMEN LIMITED RIGHT UPPER QUADRANT COMPARISON:  None. FINDINGS: Gallbladder: The gallbladder is mildly distended and contains layering sludge and small shadowing stones which appear mobile on decubitus positioning. There is no gallbladder wall thickening or pericholecystic fluid identified. The sonographic Percell Miller sign is reportedly negative. Common bile duct: Diameter: 5 mm in proximal diameter. Liver: No focal lesion identified. Within normal limits in parenchymal echogenicity. Portal vein is patent on color Doppler imaging with normal direction of blood flow towards the liver. Other: None. IMPRESSION: Cholelithiasis without sonographic evidence of acute cholecystitis. Electronically Signed   By: Fidela Salisbury M.D.   On: 06/04/2021 04:02      Assessment/Plan This is a 80 year old male presenting with acute abdominal pain, elevated LFTs, and cholelithiasis.  I reviewed his CT scan and RUQ [place].  There is mild stranding around the gallbladder on his CT scan concerning for cholecystitis.  He has multiple shadowing gallstones.  His LFTs are  consistent with an obstructive pattern, so the first priority will be biliary decompression.  He will need an MRCP to evaluate for choledocholithiasis, and if positive will need a GI consult for ERCP.  He will also need a cholecystectomy prior to discharge to prevent recurrent choledocholithiasis.  Surgery will follow closely.  Next steps pending results of MRCP.   Michaelle Birks, MD  [facility], Hepatobiliary and Pancreatic Surgery 06/04/21 6:46 AM

## 2021-06-04 NOTE — Progress Notes (Signed)
Pharmacy Antibiotic Note  Chad Oneill is a 80 y.o. male admitted on 06/04/2021 with  intra-abd infection .  Pharmacy has been consulted for Zosyn dosing.  Plan: Zosyn 3.375gm IV q8h Will f/u renal function, micro data, and pt's clinical condition  Height: '6\' 1"'$  (185.4 cm) Weight: 111.1 kg (245 lb) IBW/kg (Calculated) : 79.9  Temp (24hrs), Avg:98 F (36.7 C), Min:98 F (36.7 C), Max:98 F (36.7 C)  Recent Labs  Lab 06/04/21 0032  WBC 7.7  CREATININE 1.37*    Estimated Creatinine Clearance: 57.1 mL/min (A) (by C-G formula based on SCr of 1.37 mg/dL (H)).    Allergies  Allergen Reactions   Other     Insect stings- hives, difficulty breathing   Mercury Rash    REACTION: rash    Antimicrobials this admission: 9/6 Zosyn >>    Microbiology results: Pending  Thank you for allowing pharmacy to be a part of this patient's care.  Sherlon Handing, PharmD, BCPS Please see amion for complete clinical pharmacist phone list 06/04/2021 5:17 AM

## 2021-06-04 NOTE — ED Notes (Signed)
Pt stated pain in stomach originally started Friday but went away. Pt pain came back today. He took Pepto but he feels it made it worse. He states the pain comes and goes.

## 2021-06-04 NOTE — H&P (Signed)
History and Physical    Chad Oneill UEK:[id number] DOB: 1941-08-02 DOA: 06/04/2021  PCP: Prince Solian, MD  Patient coming from: Home  I have personally briefly reviewed patient's old medical records in [place]  Chief Complaint: Abd pain  HPI: Chad Oneill is a 80 y.o. male with medical history significant of DM2, HTN.  Pt presents to the ED with c/o epigastric abd pain.  Onset Friday, seemed to resolve throughout day on Sat, but then pain returned yesterday evening.  Initially thought acid reflux so took pepto.  But actually that seemed to make pain worse.  Has nausea but no vomiting.  No change in BMs.  No fever.  Has been taking tylenol for pain as well but doesn't think he ODd on this.   ED Course: AST 800, ALT 467, tBILI 4.0, ALK 334.  WBC nl.  Tylenol 10  EtOH neg.  RUQ [place] = 31m duct, cholelithiasis but no cholecystitis.  CT scan of abd pelvis however = despite the earlier unimpressive RUQ [place] is quite obvious on CT scan that pt has suspicious findings for acute cholecystitis.  Given empiric zosyn.   Review of Systems: As per HPI, otherwise all review of systems negative.  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 He has quit using smokeless tobacco. He reports current alcohol use of about 12.0 standard drinks per week. He reports that he does not use drugs.  Allergies  Allergen Reactions   Other     Insect stings- hives, difficulty breathing   Mercury Rash    REACTION: rash    Family History  Problem  Relation Age of Onset   Colon cancer Neg Hx    Esophageal cancer Neg Hx    Rectal cancer Neg Hx    Stomach cancer Neg Hx    Colon polyps Neg Hx      Prior to Admission medications   Medication Sig Start Date End Date Taking? Authorizing Provider  acetaminophen (TYLENOL) 500 MG tablet Take 1,000 mg by mouth every 6 (six) hours as needed for moderate pain.   Yes [provider]  aspirin EC 81 MG tablet Take 81 mg by mouth at bedtime.   Yes [provider]  atorvastatin (LIPITOR) 10 MG tablet Take 10 mg by mouth at bedtime.   Yes [provider]  diltiazem (TIAZAC) 300 MG 24 hr capsule Take 300 mg by mouth daily.   Yes [provider]  doxazosin (CARDURA) 4 MG tablet Take 2 mg by mouth at bedtime.   Yes [provider]  Dulaglutide 0.75 MG/0.5ML SOPN Inject 0.5 mLs into the skin every Sunday.   Yes [provider]  JARDIANCE 10 MG TABS tablet Take 10 mg by mouth daily. 02/23/19  Yes [provider]  metFORMIN (GLUCOPHAGE-XR) 500 MG 24 hr tablet Take 1,000 mg by mouth 2 (two) times daily. 04/02/21  Yes [provider]  metoprolol tartrate (LOPRESSOR) 25 MG tablet Take 12.5 mg by mouth 2 (two) times daily.   Yes [provider]    Physical Exam: Vitals:   06/04/21 0022 06/04/21 0024 06/04/21 0336  BP:  (!) 157/137 119/79  Pulse:  100 (!) 105  Resp:  15 (!) 22  Temp:  98 F (36.7 C)   TempSrc:  Oral   SpO2:  92% 96%  Weight: 111.1 kg    Height: '6\' 1"'  (1.854 m)      Constitutional: NAD, calm, comfortable Eyes: PERRL, lids and conjunctivae normal ENMT: Mucous membranes are moist. Posterior pharynx clear of any exudate or lesions.Normal dentition.  Neck: normal, supple, no masses, no thyromegaly Respiratory: clear to auscultation bilaterally, no wheezing, no crackles. Normal respiratory effort. No accessory muscle use.  Cardiovascular: Regular rate and rhythm, no murmurs / rubs / gallops. No extremity edema.  2+ pedal pulses. No carotid bruits.  Abdomen: no tenderness, no masses palpated. No hepatosplenomegaly. Bowel sounds positive.  Musculoskeletal: no clubbing / cyanosis. No joint deformity upper and lower extremities. Good ROM, no contractures. Normal muscle tone.  Skin: no rashes, lesions, ulcers. No induration Neurologic: CN 2-12 grossly intact. Sensation intact, DTR normal. Strength 5/5 in all 4.  Psychiatric: Normal judgment and insight. Alert and oriented x 3. Normal mood.    Labs on Admission: I have personally reviewed following labs and imaging studies  CBC: Recent Labs  Lab 06/04/21 0032  WBC 7.7  HGB 15.7  HCT 46.1  MCV 96.0  PLT 932   Basic Metabolic Panel: Recent Labs  Lab 06/04/21 0032  NA 132*  K 3.5  CL 93*  CO2 21*  GLUCOSE 214*  BUN 19  CREATININE 1.37*  CALCIUM 9.3   GFR: Estimated Creatinine Clearance: 57.1 mL/min (A) (by C-G formula based on SCr of 1.37 mg/dL (H)). Liver Function Tests: Recent Labs  Lab 06/04/21 0032  AST 800*  ALT 467*  ALKPHOS 334*  BILITOT 4.0*  PROT 7.2  ALBUMIN 3.2*   Recent Labs  Lab 06/04/21 0032  LIPASE 27   No results for input(s): AMMONIA in the last 168 hours. Coagulation Profile: No results for input(s): INR, PROTIME in the last 168 hours. Cardiac Enzymes: No results for input(s): CKTOTAL, CKMB, CKMBINDEX, TROPONINI in the last 168 hours. BNP (last 3 results) No results for input(s): PROBNP in the last 8760 hours. HbA1C: No results for input(s): HGBA1C in the last 72 hours. CBG: No results for input(s): GLUCAP in the last 168 hours. Lipid Profile: No results for input(s): CHOL, HDL, LDLCALC, TRIG, CHOLHDL, LDLDIRECT in the last 72 hours. Thyroid Function Tests: No results for input(s): TSH, T4TOTAL, FREET4, T3FREE, THYROIDAB in the last 72 hours. Anemia Panel: No results for input(s): VITAMINB12, FOLATE, FERRITIN, TIBC, IRON, RETICCTPCT in the last 72 hours. Urine analysis:    Component Value  Date/Time   COLORURINE AMBER (A) 06/04/2021 0026   APPEARANCEUR HAZY (A) 06/04/2021 0026   LABSPEC 1.026 06/04/2021 0026   PHURINE 5.0 06/04/2021 0026   GLUCOSEU >=500 (A) 06/04/2021 0026   HGBUR SMALL (A) 06/04/2021 0026   BILIRUBINUR NEGATIVE 06/04/2021 0026   KETONESUR 20 (A) 06/04/2021 0026   PROTEINUR 30 (A) 06/04/2021 0026   NITRITE NEGATIVE 06/04/2021 0026   LEUKOCYTESUR NEGATIVE 06/04/2021 0026    Radiological Exams on Admission: CT ABDOMEN PELVIS W CONTRAST  Result Date: 06/04/2021 CLINICAL DATA:  80 year old male with epigastric pain. Left lower quadrant pain. Abnormal LFTs. EXAM: CT ABDOMEN AND PELVIS  WITH CONTRAST TECHNIQUE: Multidetector CT imaging of the abdomen and pelvis was performed using the standard protocol following bolus administration of intravenous contrast. CONTRAST:  154m OMNIPAQUE IOHEXOL 350 MG/ML SOLN COMPARISON:  Right upper quadrant ultrasound 0345 hours today. FINDINGS: Lower chest: Patchy dependent and peribronchial opacity in both lungs more resembles atelectasis than infection. There is no pleural or pericardial effusion. Hepatobiliary: Cholelithiasis is evident by CT as demonstrated earlier today. But despite the fairly normal gallbladder wall thickness by earlier ultrasound there is pericholecystic inflammation demonstrated on series 3, image 33, coronal image 31, and the gallbladder wall appears indistinct. There is also inflammatory stranding in the mesentery of the adjacent transverse colon. There is subtle hyperenhancement of the liver parenchyma adjacent to the gallbladder fossa. No convincing bile duct enlargement. Elsewhere liver enhancement is normal. No free fluid. Pancreas: Negative. Spleen: Negative. Adrenals/Urinary Tract: Normal adrenal glands. Nonobstructed kidneys with symmetric renal enhancement and contrast excretion. Ureters are decompressed and normal. Negative bladder. Stomach/Bowel: Diverticulosis throughout a redundant sigmoid colon.  Diverticulosis throughout the descending colon. Intermittent diverticula in the transverse colon. The transverse colon appears mildly inflamed adjacent to the gallbladder (coronal image 31). Diverticulosis also in the ascending colon. Cecum is distended with gas and stool. No other large bowel inflammation is identified. Normal retrocecal appendix on series 3, image 60. The cecum is on a lax mesentery located anteriorly. The terminal ileum is decompressed and negative. No dilated small bowel. Stomach and duodenum remain within normal limits. No free air or free fluid. Vascular/Lymphatic: Aortoiliac calcified atherosclerosis. Major arterial structures are patent. Portal venous system is patent. No lymphadenopathy. Reproductive: Negative. Other: No pelvic free fluid.  Numerous pelvic phleboliths. Musculoskeletal: Flowing endplate osteophytes resulting in interbody ankylosis in the lower thoracic spine to L1. Lumbar facet degeneration. No acute osseous abnormality identified. IMPRESSION: 1. Cholelithiasis with Pericholecystic Inflammation highly suspicious for Acute Cholecystitis despite the earlier Ultrasound. Mild secondary inflammation of the adjacent hepatic flexure. No free fluid or other complicating features. 2. Extensive diverticulosis of the large bowel, but no other large bowel inflammation identified. Normal appendix. 3. Patchy dependent and peribronchial opacity in both lungs more resembles atelectasis than infection. No pleural effusion. 4. Aortic Atherosclerosis (ICD10-I70.0). Electronically Signed   By: HGenevie AnnM.D.   On: 06/04/2021 04:27   [place]domen Limited RUQ (LIVER/GB)  Result Date: 06/04/2021 CLINICAL DATA:  Right upper quadrant abdominal pain EXAM: ULTRASOUND ABDOMEN LIMITED RIGHT UPPER QUADRANT COMPARISON:  None. FINDINGS: Gallbladder: The gallbladder is mildly distended and contains layering sludge and small shadowing stones which appear mobile on decubitus positioning. There is no  gallbladder wall thickening or pericholecystic fluid identified. The sonographic MPercell Millersign is reportedly negative. Common bile duct: Diameter: 5 mm in proximal diameter. Liver: No focal lesion identified. Within normal limits in parenchymal echogenicity. Portal vein is patent on color Doppler imaging with normal direction of blood flow towards the liver. Other: None. IMPRESSION: Cholelithiasis without sonographic evidence of acute cholecystitis. Electronically Signed   By: AFidela SalisburyM.D.   On: 06/04/2021 04:02    EKG: Independently reviewed.  Assessment/Plan Principal Problem:   Acute cholecystitis Active Problems:   Transaminitis   DM2 (diabetes mellitus, type 2) (HCC)   HTN (hypertension)    Acute cholecystitis - Also with transaminitis, concern for possible choledocholithiasis. Though CBD only 555mon [place] spoke with gen surg: Will see pt in consult Asks that we consult GI re: question of choledocholithiasis. Sent message to GI for AM consult NPO IVF Cont empiric  zosyn for now Repeat CMP tomorrow AM Hepatitis pnl also sent DM2 - Hold home meds Sensitive SSI Q4H while NPO HTN - Holding home meds for the moment Restart later if BP rises vs use short acting  DVT prophylaxis: SCDs Code Status: Full Family Communication: Wife at bedside Disposition Plan: Home after cleared by gen surg and GI Consults called: EDP spoke with Dr. Zenia Resides, I sent message to Dr. Bryan Lemma Admission status: Admit to inpatient  Severity of Illness: The appropriate patient status for this patient is INPATIENT. Inpatient status is judged to be reasonable and necessary in order to provide the required intensity of service to ensure the patient's safety. The patient's presenting symptoms, physical exam findings, and initial radiographic and laboratory data in the context of their chronic comorbidities is felt to place them at high risk for further clinical deterioration. Furthermore, it is not  anticipated that the patient will be medically stable for discharge from the hospital within 2 midnights of admission. The following factors support the patient status of inpatient.   IP status for acute cholecystitis, with LFT elevations worrisome for choledocholithiasis.   * I certify that at the point of admission it is my clinical judgment that the patient will require inpatient hospital care spanning beyond 2 midnights from the point of admission due to high intensity of service, high risk for further deterioration and high frequency of surveillance required.*   Saanya Zieske M. DO [facility]  How to contact the [facility] Attending or Consulting provider [place] or covering provider during after hours [place], for this patient?  Check the care team in [facility] and look for a) attending/consulting [facility] provider listed and b) the [facility] team listed Log into [email or web address]  Amion Physician Scheduling and messaging for groups and whole hospitals  On call and physician scheduling software for group practices, residents, hospitalists and other medical providers for call, clinic, rotation and shift schedules. On[employer] is a hospital-wide system for scheduling doctors and paging doctors on call. EasyPlot is for scientific plotting and data analysis.  [email or web address]  and use [place]'s universal password to access. If you do not have the password, please contact the hospital operator.  Locate the [facility] provider you are looking for under [facility] and page to a number that you can be directly reached. If you still have difficulty reaching the provider, please page the [facility] (Director on Call) for the Hospitalists listed on amion for assistance.  06/04/2021, 5:14 AM

## 2021-06-04 NOTE — ED Notes (Signed)
Patient transported to CT 

## 2021-06-04 NOTE — Consult Note (Addendum)
[facility] Consult: 8:21 AM 06/04/2021  LOS: 0 days    Referring Provider: Irine Seal MD  Primary Care Physician:  Prince Solian, MD Primary Gastroenterologist:  Dr. Scarlette Shorts    Reason for Consultation:  Abdominal pain.  Elevated LFTs   HPI: Chad Oneill is a 80 y.o. male.  Past history of NIDDM.  Hypertension.  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  For surveillance of previous adenomatous colon polyps in 2010 and 2015.  Multiple small and large diverticula.  Nonbleeding internal hemorrhoids.  2 polyps removed, 1 to 2 cm sized.  Pathology tubular adenomas without HGD.  First incidence of left mid abdominal pain occurred on Friday afternoon.  This was not associated with a meal.  The pain radiated over to the right and mid abdomen.  No nausea or vomiting but appetite became depressed.  When he woke up on Saturday pain was better and continued to be improved that day and through Monday afternoon when the pain recurred, same location.  No nausea or vomiting.  A dose of extra strength Tylenol's and bismuth did not help.  Today he notes his urine looks darker.  Had a darker stool but again he did take the bismuth.  Pain better after receiving morphine, Zofran in the ED.  Required a dose of IV Ativan to address anxiety associated with MRI.  Initial BP 157/137 w heart rate of 105.  Currently 102/68, heart rate 88.  Room air sats in the mid 90s.  No fevers. T bili 4.  Alk phos 334.  AST/ALT 800/467.  APAP level 10.  ETOH < 10.   Na 132.  Glucose 214.  Creatinine 1.3, normal BUN.  Coronavirus negative. Abdominal ultrasound: CBD 5 mm.  Cholelithiasis but no acute cholecystitis.  Normal portal vein flow. CTAP w contrast: Cholelithiasis with high suspicion for pericholecystic  inflammation and some secondary inflammation at hepatic flexure.  Extensive colon diverticulosis without complication.  Dependent opacities in both lungs likely atelectasis.  Aortic atherosclerosis. MRI/MRCP: Acute cholecystitis but no choledocholithiasis.  Mildly dilated intrahepatics and CBD, CBD up to 7 mm.    Michaelle Birks from general surgery consulted on patient early this morning.  She has not written follow-up since the MR resulted but looks like the plan would be for laparoscopic cholecystectomy given no evidence for CBD stones on MRI.  Social history.  Previous smoker and chewer of tobacco.  Current drinker.  Normal alcohol consumption is about 3 ounces of alcohol daily.  We will have a generous pour of bourbon (3 fingers) or beer before dinner and sometimes a glass of wine with dinner.      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 Authorizing Provider  acetaminophen (TYLENOL) 500 MG tablet Take 1,000 mg by mouth every 6 (six) hours as needed for moderate pain.   Yes [provider]  aspirin EC 81 MG tablet Take 81 mg by mouth at bedtime.   Yes [provider]  atorvastatin (LIPITOR) 10 MG tablet Take 10 mg by mouth at bedtime.   Yes [provider]  diltiazem (TIAZAC) 300 MG 24 hr capsule Take 300 mg by mouth daily.   Yes [provider]  doxazosin (CARDURA) 4 MG tablet Take 2 mg by mouth at bedtime.   Yes [provider]  Dulaglutide 0.75 MG/0.5ML SOPN Inject 0.5 mLs into  the skin every Sunday.   Yes [provider]  JARDIANCE 10 MG TABS tablet Take 10 mg by mouth daily. 02/23/19  Yes [provider]  metFORMIN (GLUCOPHAGE-XR) 500 MG 24 hr tablet Take 1,000 mg by mouth 2 (two) times daily. 04/02/21  Yes [provider]  metoprolol tartrate (LOPRESSOR) 25 MG tablet Take 12.5 mg by mouth 2 (two) times daily.   Yes [provider]    Scheduled Meds:  insulin aspart  0-9 Units Subcutaneous Q4H   Infusions:  lactated ringers 125 mL/hr at 06/04/21 0601   piperacillin-tazobactam (ZOSYN)  IV     PRN Meds: LORazepam, morphine injection, ondansetron **OR** ondansetron (ZOFRAN) IV   Allergies as of 06/04/2021 - Review Complete 06/04/2021  Allergen Reaction Noted   Other  01/17/2014   Mercury Rash 11/07/2008    Family History  Problem Relation Age of Onset   Colon cancer Neg Hx    Esophageal cancer Neg Hx    Rectal cancer Neg Hx    Stomach cancer Neg Hx    Colon polyps Neg Hx     Social History   Socioeconomic History   Marital status: Married    Spouse name: Not on file   Number of children: Not on file   Years of education: Not on file   Highest education level: Not on file  Occupational History   Not on file  Tobacco Use   Smoking status: Former   Smokeless tobacco: Former   Tobacco comments:    quit smoking 40 years ago  Vaping Use   Vaping Use: Never used  Substance and Sexual Activity   Alcohol use: Yes    Alcohol/week: 12.0 standard drinks    Types: 6 Standard drinks or equivalent, 6 Glasses of wine per week    Comment: "a couple of drinks a day"   Drug use: No   Sexual activity: Not on file  Other Topics Concern   Not on file  Social History Narrative   Not on file   Social Determinants of Health   Financial Resource Strain: Not on file  Food Insecurity: Not on file  Transportation Needs: Not on file  Physical Activity: Not on file  Stress: Not on file  Social Connections: Not on file   Intimate Partner Violence: Not on file    REVIEW OF SYSTEMS: Constitutional: No weakness, no fatigue.  Generally able to golf, climb stairs, work in his yard without issue. ENT:  No nose bleeds Pulm: No shortness of breath or cough. CV:  No palpitations,  no LE edema.  GU: Urine today is a bit darker than normal.  No hematuria, no frequency GI: See HPI. Heme: Denies unusual bleeding or bruising. Transfusions: None. Neuro:  No headaches, no peripheral tingling or numbness.  No seizures, no syncope. Derm:  No itching, no rash or sores.  Endocrine:  No sweats or chills.  No polyuria or dysuria Immunization: Received Pfizer COVID-19 vaccines at least twice. Travel: Not queried.   PHYSICAL EXAM: Vital signs in last 24 hours: Vitals:   06/04/21 0024 06/04/21 0336  BP: (!) 157/137 119/79  Pulse: 100 (!) 105  Resp: 15 (!) 22  Temp: 98 F (36.7 C)   SpO2: 92% 96%   Wt Readings from Last 3 Encounters:  06/04/21 111.1 kg  06/29/19 110.7 kg  06/15/19 110.7 kg    General: Overweight, not ill or toxic appearing.  Not jaundiced. Head: No facial asymmetry or swelling.  No signs of head trauma. Eyes: No scleral icterus.  No conjunctival pallor. Ears: Slight HOH Nose: No congestion or discharge Mouth: Oral mucosa moist, pink, clear.  Tongue midline. Neck: No JVD, no masses, no thyromegaly Lungs: Clear bilaterally without labored breathing or cough. Heart: RRR.  No MRG.  S1, S2 present. Abdomen: Soft, minimal tenderness in the left waist area of the abdomen.  No guarding or rebound.  Active bowel sounds.  No distention.  No masses, no HSM.Marland Kitchen   Rectal: Deferred. Musc/Skeltl: No joint redness, swelling or gross deformities. Extremities: No CCE. Neurologic: Alert.  Oriented x3.  Good historian.  No tremors or limb weakness. Skin: No jaundice.  No telangiectasia.  No significant purpura or bruising. Tattoos: None observed Nodes: No cervical adenopathy Psych: Pleasant, calm,  cooperative.  Intake/Output from previous day: No intake/output data recorded. Intake/Output this shift: No intake/output data recorded.  LAB RESULTS: Recent Labs    06/04/21 0032  WBC 7.7  HGB 15.7  HCT 46.1  PLT 193   BMET Lab Results  Component Value Date   NA 132 (L) 06/04/2021   NA 138 08/31/2017   NA 139 07/24/2008   K 3.5 06/04/2021   K 3.7 08/31/2017   K 3.8 07/24/2008   CL 93 (L) 06/04/2021   CL 101 08/31/2017   CO2 21 (L) 06/04/2021   CO2 27 08/31/2017   GLUCOSE 214 (H) 06/04/2021   GLUCOSE 150 (H) 08/31/2017   GLUCOSE 150 (H) 07/24/2008   BUN 19 06/04/2021   BUN 9 08/31/2017   CREATININE 1.37 (H) 06/04/2021   CREATININE 0.84 08/31/2017   CALCIUM 9.3 06/04/2021   CALCIUM 8.8 (L) 08/31/2017   LFT Recent Labs    06/04/21 0032  PROT 7.2  ALBUMIN 3.2*  AST 800*  ALT 467*  ALKPHOS 334*  BILITOT 4.0*   PT/INR No results found for: INR, PROTIME Hepatitis Panel No results for input(s): HEPBSAG, HCVAB, HEPAIGM, HEPBIGM in the last 72 hours. C-Diff No components found for: CDIFF Lipase     Component Value Date/Time   LIPASE 27 06/04/2021 0032    Drugs of Abuse  No results found for: LABOPIA, COCAINSCRNUR, LABBENZ, AMPHETMU, THCU, LABBARB   RADIOLOGY STUDIES: CT ABDOMEN PELVIS W CONTRAST  Result Date: 06/04/2021 CLINICAL DATA:  80 year old male with epigastric pain. Left lower quadrant pain. Abnormal LFTs. EXAM: CT ABDOMEN AND PELVIS WITH CONTRAST TECHNIQUE: Multidetector CT imaging of the abdomen and pelvis was performed using the standard protocol following bolus administration of intravenous contrast. CONTRAST:  172m OMNIPAQUE IOHEXOL 350 MG/ML SOLN COMPARISON:  Right upper quadrant  ultrasound 0345 hours today. FINDINGS: Lower chest: Patchy dependent and peribronchial opacity in both lungs more resembles atelectasis than infection. There is no pleural or pericardial effusion. Hepatobiliary: Cholelithiasis is evident by CT as demonstrated earlier  today. But despite the fairly normal gallbladder wall thickness by earlier ultrasound there is pericholecystic inflammation demonstrated on series 3, image 33, coronal image 31, and the gallbladder wall appears indistinct. There is also inflammatory stranding in the mesentery of the adjacent transverse colon. There is subtle hyperenhancement of the liver parenchyma adjacent to the gallbladder fossa. No convincing bile duct enlargement. Elsewhere liver enhancement is normal. No free fluid. Pancreas: Negative. Spleen: Negative. Adrenals/Urinary Tract: Normal adrenal glands. Nonobstructed kidneys with symmetric renal enhancement and contrast excretion. Ureters are decompressed and normal. Negative bladder. Stomach/Bowel: Diverticulosis throughout a redundant sigmoid colon. Diverticulosis throughout the descending colon. Intermittent diverticula in the transverse colon. The transverse colon appears mildly inflamed adjacent to the gallbladder (coronal image 31). Diverticulosis also in the ascending colon. Cecum is distended with gas and stool. No other large bowel inflammation is identified. Normal retrocecal appendix on series 3, image 60. The cecum is on a lax mesentery located anteriorly. The terminal ileum is decompressed and negative. No dilated small bowel. Stomach and duodenum remain within normal limits. No free air or free fluid. Vascular/Lymphatic: Aortoiliac calcified atherosclerosis. Major arterial structures are patent. Portal venous system is patent. No lymphadenopathy. Reproductive: Negative. Other: No pelvic free fluid.  Numerous pelvic phleboliths. Musculoskeletal: Flowing endplate osteophytes resulting in interbody ankylosis in the lower thoracic spine to L1. Lumbar facet degeneration. No acute osseous abnormality identified. IMPRESSION: 1. Cholelithiasis with Pericholecystic Inflammation highly suspicious for Acute Cholecystitis despite the earlier Ultrasound. Mild secondary inflammation of the  adjacent hepatic flexure. No free fluid or other complicating features. 2. Extensive diverticulosis of the large bowel, but no other large bowel inflammation identified. Normal appendix. 3. Patchy dependent and peribronchial opacity in both lungs more resembles atelectasis than infection. No pleural effusion. 4. Aortic Atherosclerosis (ICD10-I70.0). Electronically Signed   By: Genevie Ann M.D.   On: 06/04/2021 04:27   US Abdomen Limited RUQ (LIVER/GB)  Result Date: 06/04/2021 CLINICAL DATA:  Right upper quadrant abdominal pain EXAM: ULTRASOUND ABDOMEN LIMITED RIGHT UPPER QUADRANT COMPARISON:  None. FINDINGS: Gallbladder: The gallbladder is mildly distended and contains layering sludge and small shadowing stones which appear mobile on decubitus positioning. There is no gallbladder wall thickening or pericholecystic fluid identified. The sonographic Percell Miller sign is reportedly negative. Common bile duct: Diameter: 5 mm in proximal diameter. Liver: No focal lesion identified. Within normal limits in parenchymal echogenicity. Portal vein is patent on color Doppler imaging with normal direction of blood flow towards the liver. Other: None. IMPRESSION: Cholelithiasis without sonographic evidence of acute cholecystitis. Electronically Signed   By: Fidela Salisbury M.D.   On: 06/04/2021 04:02     IMPRESSION:      Elevated LFTs.  No dilated biliary ducts or evidence for choledocholithiasis on imaging.  Cholelithiasis.  Imaging suspicious for acute cholelithiasis.    NIDDM    PLAN:       Lap chole per Dr Zenia Resides.  Defer decision regarding diet/oral intake to surgery team.   Azucena Freed  06/04/2021, 8:21 AM Phone [phone]   Attending physician's note   I have taken an interval history, reviewed the chart and examined the patient. I agree with the Advanced Practitioner's note, impression and recommendations.   Acute calculus cholecystitis Abn LFTs with neg MRCP for CBD stones.  May  have passed the  stone.  Plan: -IV Zosyn -Trend LFTs -Lap chole with IOC per surgery. If IOC is +, then would consider ERCP. Will standby for now. -D/W pt and pt's wife. -Will discuss with Dr. Zenia Resides.   Carmell Austria, MD [facility] [phone]

## 2021-06-05 ENCOUNTER — Observation Stay (HOSPITAL_COMMUNITY): Payer: Medicare Other | Admitting: Certified Registered Nurse Anesthetist

## 2021-06-05 ENCOUNTER — Encounter (HOSPITAL_COMMUNITY): Payer: Self-pay | Admitting: Internal Medicine

## 2021-06-05 ENCOUNTER — Encounter (HOSPITAL_COMMUNITY)
Admission: EM | Disposition: A | Discharge status: Home / Self Care | Payer: Self-pay | Source: Home / Self Care | Attending: Internal Medicine

## 2021-06-05 ENCOUNTER — Observation Stay (HOSPITAL_COMMUNITY): Payer: Medicare Other

## 2021-06-05 DIAGNOSIS — K810 Acute cholecystitis: Secondary | ICD-10-CM | POA: Diagnosis not present

## 2021-06-05 HISTORY — PX: INTRAOPERATIVE CHOLANGIOGRAM: SHX5230

## 2021-06-05 HISTORY — PX: CHOLECYSTECTOMY: SHX55

## 2021-06-05 LAB — CBC
HCT: 39.8 % (ref 39.0–52.0)
HCT: 42.2 % (ref 39.0–52.0)
Hemoglobin: 13.7 g/dL (ref 13.0–17.0)
Hemoglobin: 14.1 g/dL (ref 13.0–17.0)
MCH: 32 pg (ref 26.0–34.0)
MCH: 32 pg (ref 26.0–34.0)
MCHC: 34.4 g/dL (ref 30.0–36.0)
MCHC: 33.4 g/dL (ref 30.0–36.0)
MCV: 93 fL (ref 80.0–100.0)
MCV: 95.7 fL (ref 80.0–100.0)
Platelets: 190 10*3/uL (ref 150–400)
Platelets: 188 10*3/uL (ref 150–400)
RBC: 4.28 MIL/uL (ref 4.22–5.81)
RBC: 4.41 MIL/uL (ref 4.22–5.81)
RDW: 13.7 % (ref 11.5–15.5)
RDW: 14.1 % (ref 11.5–15.5)
WBC: 9 10*3/uL (ref 4.0–10.5)
WBC: 8.7 10*3/uL (ref 4.0–10.5)
nRBC: 0 % (ref 0.0–0.2)
nRBC: 0 % (ref 0.0–0.2)

## 2021-06-05 LAB — COMPREHENSIVE METABOLIC PANEL
ALT: 256 U/L — ABNORMAL HIGH (ref 0–44)
ALT: 218 U/L — ABNORMAL HIGH (ref 0–44)
AST: 178 U/L — ABNORMAL HIGH (ref 15–41)
AST: 137 U/L — ABNORMAL HIGH (ref 15–41)
Albumin: 2.6 g/dL — ABNORMAL LOW (ref 3.5–5.0)
Albumin: 2.5 g/dL — ABNORMAL LOW (ref 3.5–5.0)
Alkaline Phosphatase: 247 U/L — ABNORMAL HIGH (ref 38–126)
Alkaline Phosphatase: 235 U/L — ABNORMAL HIGH (ref 38–126)
Anion gap: 11 (ref 5–15)
Anion gap: 10 (ref 5–15)
BUN: 16 mg/dL (ref 8–23)
BUN: 16 mg/dL (ref 8–23)
CO2: 25 mmol/L (ref 22–32)
CO2: 27 mmol/L (ref 22–32)
Calcium: 8.7 mg/dL — ABNORMAL LOW (ref 8.9–10.3)
Calcium: 8.7 mg/dL — ABNORMAL LOW (ref 8.9–10.3)
Chloride: 96 mmol/L — ABNORMAL LOW (ref 98–111)
Chloride: 99 mmol/L (ref 98–111)
Creatinine, Ser: 1.29 mg/dL — ABNORMAL HIGH (ref 0.61–1.24)
Creatinine, Ser: 1.1 mg/dL (ref 0.61–1.24)
GFR, Estimated: 56 mL/min — ABNORMAL LOW (ref >60)
GFR, Estimated: >60 mL/min (ref >60)
Glucose, Bld: 156 mg/dL — ABNORMAL HIGH (ref 70–99)
Glucose, Bld: 181 mg/dL — ABNORMAL HIGH (ref 70–99)
Potassium: 3.1 mmol/L — ABNORMAL LOW (ref 3.5–5.1)
Potassium: 4 mmol/L (ref 3.5–5.1)
Sodium: 132 mmol/L — ABNORMAL LOW (ref 135–145)
Sodium: 136 mmol/L (ref 135–145)
Total Bilirubin: 5.5 mg/dL — ABNORMAL HIGH (ref 0.3–1.2)
Total Bilirubin: 4.5 mg/dL — ABNORMAL HIGH (ref 0.3–1.2)
Total Protein: 6.4 g/dL — ABNORMAL LOW (ref 6.5–8.1)
Total Protein: 6.5 g/dL (ref 6.5–8.1)

## 2021-06-05 LAB — MAGNESIUM
Magnesium: 1.5 mg/dL — ABNORMAL LOW (ref 1.7–2.4)
Magnesium: 2.1 mg/dL (ref 1.7–2.4)

## 2021-06-05 LAB — GLUCOSE, CAPILLARY
Glucose-Capillary: 124 mg/dL — ABNORMAL HIGH (ref 70–99)
Glucose-Capillary: 139 mg/dL — ABNORMAL HIGH (ref 70–99)
Glucose-Capillary: 151 mg/dL — ABNORMAL HIGH (ref 70–99)
Glucose-Capillary: 155 mg/dL — ABNORMAL HIGH (ref 70–99)
Glucose-Capillary: 158 mg/dL — ABNORMAL HIGH (ref 70–99)
Glucose-Capillary: 166 mg/dL — ABNORMAL HIGH (ref 70–99)
Glucose-Capillary: 184 mg/dL — ABNORMAL HIGH (ref 70–99)
Glucose-Capillary: 196 mg/dL — ABNORMAL HIGH (ref 70–99)
Glucose-Capillary: 209 mg/dL — ABNORMAL HIGH (ref 70–99)

## 2021-06-05 LAB — TROPONIN I (HIGH SENSITIVITY)
Troponin I (High Sensitivity): 14 ng/L (ref <18)
Troponin I (High Sensitivity): 11 ng/L (ref <18)

## 2021-06-05 SURGERY — LAPAROSCOPIC CHOLECYSTECTOMY
Anesthesia: General

## 2021-06-05 MED ORDER — ONDANSETRON HCL 4 MG/2ML IJ SOLN
INTRAMUSCULAR | Status: AC
  Filled 2021-06-05: qty 2

## 2021-06-05 MED ORDER — ROCURONIUM BROMIDE 10 MG/ML (PF) SYRINGE
PREFILLED_SYRINGE | INTRAVENOUS | Status: DC | PRN
Start: 2021-06-05 — End: 2021-06-05
  Administered 2021-06-05: 40 mg via INTRAVENOUS
  Administered 2021-06-05: 10 mg via INTRAVENOUS

## 2021-06-05 MED ORDER — PHENYLEPHRINE 40 MCG/ML (10ML) SYRINGE FOR IV PUSH (FOR BLOOD PRESSURE SUPPORT)
PREFILLED_SYRINGE | INTRAVENOUS | Status: DC | PRN
  Administered 2021-06-05 (×2): 160 ug via INTRAVENOUS

## 2021-06-05 MED ORDER — METOPROLOL TARTRATE 5 MG/5ML IV SOLN
INTRAVENOUS | Status: DC | PRN
  Administered 2021-06-05: 2 mg via INTRAVENOUS
  Administered 2021-06-05: 1 mg via INTRAVENOUS

## 2021-06-05 MED ORDER — CHLORHEXIDINE GLUCONATE 0.12 % MT SOLN
15.0000 mL | Freq: Once | OROMUCOSAL | Status: AC
  Administered 2021-06-05: 15 mL via OROMUCOSAL

## 2021-06-05 MED ORDER — SODIUM CHLORIDE 0.9 % IR SOLN
Status: DC | PRN
  Administered 2021-06-05: 1000 mL

## 2021-06-05 MED ORDER — MORPHINE SULFATE (PF) 2 MG/ML IV SOLN
2.0000 mg | INTRAVENOUS | Status: DC | PRN
  Administered 2021-06-05: 2 mg via INTRAVENOUS
  Filled 2021-06-05: qty 1

## 2021-06-05 MED ORDER — DOCUSATE SODIUM 100 MG PO CAPS
100.0000 mg | ORAL_CAPSULE | Freq: Two times a day (BID) | ORAL | Status: DC
  Administered 2021-06-05 – 2021-06-07 (×5): 100 mg via ORAL
  Filled 2021-06-05 (×5): qty 1

## 2021-06-05 MED ORDER — PROPOFOL 10 MG/ML IV BOLUS
INTRAVENOUS | Status: AC
  Filled 2021-06-05: qty 20

## 2021-06-05 MED ORDER — POTASSIUM CHLORIDE 10 MEQ/100ML IV SOLN
10.0000 meq | INTRAVENOUS | Status: AC
  Administered 2021-06-05 (×3): 10 meq via INTRAVENOUS
  Filled 2021-06-05 (×3): qty 100

## 2021-06-05 MED ORDER — OXYCODONE HCL 5 MG PO TABS
5.0000 mg | ORAL_TABLET | ORAL | Status: DC | PRN
  Administered 2021-06-05: 10 mg via ORAL
  Filled 2021-06-05: qty 2
  Filled 2021-06-05: qty 1

## 2021-06-05 MED ORDER — 0.9 % SODIUM CHLORIDE (POUR BTL) OPTIME
TOPICAL | Status: DC | PRN
  Administered 2021-06-05: 1000 mL

## 2021-06-05 MED ORDER — BUPIVACAINE-EPINEPHRINE 0.25% -1:200000 IJ SOLN
INTRAMUSCULAR | Status: DC | PRN
  Administered 2021-06-05: 12 mL

## 2021-06-05 MED ORDER — METOPROLOL TARTRATE 5 MG/5ML IV SOLN
2.5000 mg | Freq: Four times a day (QID) | INTRAVENOUS | Status: DC | PRN
  Administered 2021-06-05 – 2021-06-06 (×2): 2.5 mg via INTRAVENOUS
  Filled 2021-06-05: qty 5

## 2021-06-05 MED ORDER — LIDOCAINE 2% (20 MG/ML) 5 ML SYRINGE
INTRAMUSCULAR | Status: AC
  Filled 2021-06-05: qty 5

## 2021-06-05 MED ORDER — METOPROLOL TARTRATE 5 MG/5ML IV SOLN
INTRAVENOUS | Status: AC
  Filled 2021-06-05: qty 5

## 2021-06-05 MED ORDER — PHENYLEPHRINE 40 MCG/ML (10ML) SYRINGE FOR IV PUSH (FOR BLOOD PRESSURE SUPPORT)
PREFILLED_SYRINGE | INTRAVENOUS | Status: AC
  Filled 2021-06-05: qty 10

## 2021-06-05 MED ORDER — SUCCINYLCHOLINE CHLORIDE 200 MG/10ML IV SOSY
PREFILLED_SYRINGE | INTRAVENOUS | Status: DC | PRN
  Administered 2021-06-05: 160 mg via INTRAVENOUS

## 2021-06-05 MED ORDER — LACTATED RINGERS IV SOLN
INTRAVENOUS | Status: DC
Start: 2021-06-05 — End: 2021-06-07

## 2021-06-05 MED ORDER — FENTANYL CITRATE (PF) 250 MCG/5ML IJ SOLN
INTRAMUSCULAR | Status: DC | PRN
  Administered 2021-06-05 (×2): 50 ug via INTRAVENOUS

## 2021-06-05 MED ORDER — EPHEDRINE 5 MG/ML INJ
INTRAVENOUS | Status: AC
  Filled 2021-06-05: qty 5

## 2021-06-05 MED ORDER — GLUCAGON HCL RDNA (DIAGNOSTIC) 1 MG IJ SOLR
INTRAMUSCULAR | Status: AC
  Filled 2021-06-05: qty 1

## 2021-06-05 MED ORDER — PHENYLEPHRINE HCL-NACL 20-0.9 MG/250ML-% IV SOLN
INTRAVENOUS | Status: DC | PRN
  Administered 2021-06-05: 50 ug/min via INTRAVENOUS

## 2021-06-05 MED ORDER — NALOXONE HCL 0.4 MG/ML IJ SOLN: 0.4000 mg | INTRAMUSCULAR | Status: DC | PRN

## 2021-06-05 MED ORDER — FENTANYL CITRATE (PF) 100 MCG/2ML IJ SOLN
INTRAMUSCULAR | Status: AC
  Filled 2021-06-05: qty 2

## 2021-06-05 MED ORDER — IOHEXOL 300 MG/ML  SOLN
INTRAMUSCULAR | Status: DC | PRN
  Administered 2021-06-05: 20 mL

## 2021-06-05 MED ORDER — LACTATED RINGERS IV SOLN: INTRAVENOUS | Status: DC

## 2021-06-05 MED ORDER — OXYCODONE HCL 5 MG/5ML PO SOLN: 5.0000 mg | Freq: Once | ORAL | Status: DC | PRN

## 2021-06-05 MED ORDER — PROPOFOL 10 MG/ML IV BOLUS
INTRAVENOUS | Status: DC | PRN
  Administered 2021-06-05: 150 mg via INTRAVENOUS

## 2021-06-05 MED ORDER — HYDROMORPHONE HCL 1 MG/ML IJ SOLN
0.5000 mg | INTRAMUSCULAR | Status: DC | PRN
  Filled 2021-06-05: qty 1

## 2021-06-05 MED ORDER — ONDANSETRON HCL 4 MG/2ML IJ SOLN
INTRAMUSCULAR | Status: DC | PRN
  Administered 2021-06-05: 4 mg via INTRAVENOUS

## 2021-06-05 MED ORDER — METOPROLOL TARTRATE 12.5 MG HALF TABLET
12.5000 mg | ORAL_TABLET | Freq: Two times a day (BID) | ORAL | Status: DC
  Administered 2021-06-05 – 2021-06-07 (×4): 12.5 mg via ORAL
  Filled 2021-06-05 (×5): qty 1

## 2021-06-05 MED ORDER — LIDOCAINE 2% (20 MG/ML) 5 ML SYRINGE
INTRAMUSCULAR | Status: DC | PRN
Start: 2021-06-05 — End: 2021-06-05
  Administered 2021-06-05: 70 mg via INTRAVENOUS

## 2021-06-05 MED ORDER — OXYCODONE HCL 5 MG PO TABS: 5.0000 mg | ORAL_TABLET | Freq: Once | ORAL | Status: DC | PRN

## 2021-06-05 MED ORDER — ONDANSETRON HCL 4 MG/2ML IJ SOLN: 4.0000 mg | Freq: Once | INTRAMUSCULAR | Status: DC | PRN

## 2021-06-05 MED ORDER — ORAL CARE MOUTH RINSE: 15.0000 mL | Freq: Once | OROMUCOSAL | Status: AC

## 2021-06-05 MED ORDER — MAGNESIUM SULFATE 2 GM/50ML IV SOLN
2.0000 g | Freq: Once | INTRAVENOUS | Status: AC
  Administered 2021-06-05: 2 g via INTRAVENOUS
  Filled 2021-06-05: qty 50

## 2021-06-05 MED ORDER — DEXAMETHASONE SODIUM PHOSPHATE 10 MG/ML IJ SOLN
INTRAMUSCULAR | Status: DC | PRN
Start: 2021-06-05 — End: 2021-06-05
  Administered 2021-06-05: 4 mg via INTRAVENOUS

## 2021-06-05 MED ORDER — HEMOSTATIC AGENTS (NO CHARGE) OPTIME
TOPICAL | Status: DC | PRN
  Administered 2021-06-05: 1 via TOPICAL

## 2021-06-05 MED ORDER — FENTANYL CITRATE (PF) 250 MCG/5ML IJ SOLN
INTRAMUSCULAR | Status: AC
  Filled 2021-06-05: qty 5

## 2021-06-05 MED ORDER — DEXAMETHASONE SODIUM PHOSPHATE 10 MG/ML IJ SOLN
INTRAMUSCULAR | Status: AC
  Filled 2021-06-05: qty 1

## 2021-06-05 MED ORDER — FENTANYL CITRATE (PF) 100 MCG/2ML IJ SOLN
25.0000 ug | INTRAMUSCULAR | Status: DC | PRN
  Administered 2021-06-05 (×2): 25 ug via INTRAVENOUS
  Administered 2021-06-05 (×2): 50 ug via INTRAVENOUS

## 2021-06-05 MED ORDER — BUPIVACAINE-EPINEPHRINE (PF) 0.25% -1:200000 IJ SOLN
INTRAMUSCULAR | Status: AC
  Filled 2021-06-05: qty 30

## 2021-06-05 MED ORDER — CHLORHEXIDINE GLUCONATE 0.12 % MT SOLN
OROMUCOSAL | Status: AC
  Filled 2021-06-05: qty 15

## 2021-06-05 MED ORDER — GLUCAGON HCL RDNA (DIAGNOSTIC) 1 MG IJ SOLR
INTRAMUSCULAR | Status: DC | PRN
  Administered 2021-06-05: 1 mg via INTRAVENOUS

## 2021-06-05 MED ORDER — LABETALOL HCL 5 MG/ML IV SOLN
INTRAVENOUS | Status: AC
  Filled 2021-06-05: qty 4

## 2021-06-05 MED ORDER — SUGAMMADEX SODIUM 200 MG/2ML IV SOLN
INTRAVENOUS | Status: DC | PRN
  Administered 2021-06-05: 300 mg via INTRAVENOUS

## 2021-06-05 MED ORDER — ACETAMINOPHEN 325 MG PO TABS
650.0000 mg | ORAL_TABLET | Freq: Four times a day (QID) | ORAL | Status: DC | PRN
  Administered 2021-06-05: 650 mg via ORAL
  Filled 2021-06-05: qty 2

## 2021-06-05 SURGICAL SUPPLY — 49 items
APL PRP STRL LF DISP 70% ISPRP (MISCELLANEOUS) ×1
APL SKNCLS STERI-STRIP NONHPOA (GAUZE/BANDAGES/DRESSINGS) ×1
APPLIER CLIP ROT 10 11.4 M/L (STAPLE) ×2
APR CLP MED LRG 11.4X10 (STAPLE) ×1
BAG COUNTER SPONGE SURGICOUNT (BAG) ×2 IMPLANT
BAG SPEC RTRVL 10 TROC 200 (ENDOMECHANICALS) ×1
BAG SPEC RTRVL LRG 6X4 10 (ENDOMECHANICALS) ×1
BAG SPNG CNTER NS LX DISP (BAG) ×1
BENZOIN TINCTURE PRP APPL 2/3 (GAUZE/BANDAGES/DRESSINGS) ×2 IMPLANT
BLADE CLIPPER SURG (BLADE) ×1 IMPLANT
CANISTER SUCT 3000ML PPV (MISCELLANEOUS) ×2 IMPLANT
CHLORAPREP W/TINT 26 (MISCELLANEOUS) ×2 IMPLANT
CLIP APPLIE ROT 10 11.4 M/L (STAPLE) ×1 IMPLANT
COVER SURGICAL LIGHT HANDLE (MISCELLANEOUS) ×2 IMPLANT
DRSG TEGADERM 2-3/8X2-3/4 SM (GAUZE/BANDAGES/DRESSINGS) ×4 IMPLANT
DRSG TEGADERM 4X4.75 (GAUZE/BANDAGES/DRESSINGS) ×2 IMPLANT
ELECT REM PT RETURN 9FT ADLT (ELECTROSURGICAL) ×2
ELECTRODE REM PT RTRN 9FT ADLT (ELECTROSURGICAL) ×1 IMPLANT
GAUZE 4X4 16PLY ~~LOC~~+RFID DBL (SPONGE) ×1 IMPLANT
GAUZE SPONGE 2X2 8PLY STRL LF (GAUZE/BANDAGES/DRESSINGS) ×1 IMPLANT
GLOVE SURG ENC MOIS LTX SZ7 (GLOVE) ×2 IMPLANT
GLOVE SURG UNDER POLY LF SZ7.5 (GLOVE) ×2 IMPLANT
GOWN STRL REUS W/ TWL LRG LVL3 (GOWN DISPOSABLE) ×3 IMPLANT
GOWN STRL REUS W/TWL LRG LVL3 (GOWN DISPOSABLE) ×6
HEMOSTAT SNOW SURGICEL 2X4 (HEMOSTASIS) ×1 IMPLANT
KIT BASIN OR (CUSTOM PROCEDURE TRAY) ×2 IMPLANT
KIT TURNOVER KIT B (KITS) ×2 IMPLANT
NS IRRIG 1000ML POUR BTL (IV SOLUTION) ×2 IMPLANT
PAD ARMBOARD 7.5X6 YLW CONV (MISCELLANEOUS) ×2 IMPLANT
POUCH RETRIEVAL ECOSAC 10 (ENDOMECHANICALS) IMPLANT
POUCH RETRIEVAL ECOSAC 10MM (ENDOMECHANICALS) ×2
POUCH SPECIMEN RETRIEVAL 10MM (ENDOMECHANICALS) ×2 IMPLANT
SCISSORS LAP 5X35 DISP (ENDOMECHANICALS) ×2 IMPLANT
SET IRRIG TUBING LAPAROSCOPIC (IRRIGATION / IRRIGATOR) ×2 IMPLANT
SET TUBE SMOKE EVAC HIGH FLOW (TUBING) ×2 IMPLANT
SLEEVE ENDOPATH XCEL 5M (ENDOMECHANICALS) ×2 IMPLANT
SPECIMEN JAR SMALL (MISCELLANEOUS) ×2 IMPLANT
SPONGE GAUZE 2X2 8PLY STRL LF (GAUZE/BANDAGES/DRESSINGS) ×1 IMPLANT
SPONGE GAUZE 2X2 STER 10/PKG (GAUZE/BANDAGES/DRESSINGS) ×1
SPONGE T-LAP 18X18 ~~LOC~~+RFID (SPONGE) ×1 IMPLANT
STRIP CLOSURE SKIN 1/2X4 (GAUZE/BANDAGES/DRESSINGS) ×2 IMPLANT
SUT MNCRL AB 4-0 PS2 18 (SUTURE) ×2 IMPLANT
TOWEL GREEN STERILE (TOWEL DISPOSABLE) ×2 IMPLANT
TOWEL GREEN STERILE FF (TOWEL DISPOSABLE) ×2 IMPLANT
TRAY LAPAROSCOPIC MC (CUSTOM PROCEDURE TRAY) ×2 IMPLANT
TROCAR XCEL BLUNT TIP 100MML (ENDOMECHANICALS) ×2 IMPLANT
TROCAR XCEL NON-BLD 11X100MML (ENDOMECHANICALS) ×2 IMPLANT
TROCAR XCEL NON-BLD 5MMX100MML (ENDOMECHANICALS) ×2 IMPLANT
WATER STERILE IRR 1000ML POUR (IV SOLUTION) ×2 IMPLANT

## 2021-06-05 NOTE — Anesthesia Preprocedure Evaluation (Addendum)
Anesthesia Evaluation  Patient identified by MRN, date of birth, ID band Patient awake    Reviewed: Allergy & Precautions, NPO status , Patient's Chart, lab work & pertinent test results  Airway Mallampati: III  TM Distance: >3 FB Neck ROM: Full    Dental   Pulmonary neg pulmonary ROS, former smoker,    Pulmonary exam normal breath sounds clear to auscultation       Cardiovascular hypertension, Pt. on medications negative cardio ROS Normal cardiovascular exam Rhythm:Regular Rate:Normal     Neuro/Psych negative neurological ROS  negative psych ROS   GI/Hepatic Neg liver ROS, trasaminitis cholecystitis   Endo/Other  negative endocrine ROSdiabetes, Well Controlled, Type 2  Renal/GU negative Renal ROS  negative genitourinary   Musculoskeletal negative musculoskeletal ROS (+)   Abdominal   Peds negative pediatric ROS (+)  Hematology negative hematology ROS (+)   Anesthesia Other Findings   Reproductive/Obstetrics negative OB ROS                            Anesthesia Physical Anesthesia Plan  ASA: 3  Anesthesia Plan: General   Post-op Pain Management:    Induction: Intravenous  PONV Risk Score and Plan: 2  Airway Management Planned: Oral ETT and Video Laryngoscope Planned  Additional Equipment: None  Intra-op Plan:   Post-operative Plan: Extubation in OR  Informed Consent: I have reviewed the patients History and Physical, chart, labs and discussed the procedure including the risks, benefits and alternatives for the proposed anesthesia with the patient or authorized representative who has indicated his/her understanding and acceptance.       Plan Discussed with: CRNA and Anesthesiologist  Anesthesia Plan Comments:        Anesthesia Quick Evaluation

## 2021-06-05 NOTE — Progress Notes (Signed)
Received a call from bedside RN regarding the patient being hypertensive and diaphoretic.  Came to bedside.  He appears uncomfortable.  States pain was mainly at RUQ.  Diaphoretic and tachycardic.  Lungs are clear on auscultation.  Admits to flatulence. + bowel sounds.  Ordered 12 lead EKG and troponin.  Will change his status to telemetry and closely monitor overnight with continuous pulse oxymetry.  His wife and daughter are at bedside.  His wife would like to stay overnight.  Bedside RN and the Probation officer are ok with it.  Prior to leaving the room his pain was 1/10 after receiving IV Morphine.  He has mild confusion.  Changed IV Morphine to IV Dilaudid PRN for severe pain.  CBC, CMP are also pending.  We will continue to closely monitor overnight.

## 2021-06-05 NOTE — Anesthesia Procedure Notes (Signed)
Procedure Name: Intubation Date/Time: 06/05/2021 10:13 AM Performed by: Inda Coke, CRNA Pre-anesthesia Checklist: Patient identified, Emergency Drugs available, Suction available and Patient being monitored Patient Re-evaluated:Patient Re-evaluated prior to induction Oxygen Delivery Method: Circle System Utilized Preoxygenation: Pre-oxygenation with 100% oxygen Induction Type: IV induction Ventilation: Mask ventilation without difficulty Laryngoscope Size: Mac and 4 Grade View: Grade I Tube type: Oral Tube size: 7.5 mm Number of attempts: 1 Airway Equipment and Method: Stylet and Oral airway Placement Confirmation: ETT inserted through vocal cords under direct vision, positive ETCO2 and breath sounds checked- equal and bilateral Secured at: 22 cm Tube secured with: Tape Dental Injury: Teeth and Oropharynx as per pre-operative assessment

## 2021-06-05 NOTE — Progress Notes (Signed)
Subjective/Chief Complaint: Patient remains less tender, minimal RUQ tenderness Dark urine T. Bili up to 5.5   Objective: Vital signs in last 24 hours: Temp:  [97.8 F (36.6 C)-98.8 F (37.1 C)] 98.3 F (36.8 C) (09/07 0445) Pulse Rate:  [60-90] 70 (09/07 0445) Resp:  [16-20] 18 (09/07 0445) BP: (91-131)/(51-95) 125/77 (09/07 0445) SpO2:  [93 %-96 %] 94 % (09/07 0445)    Intake/Output from previous day: 09/06 0701 - 09/07 0700 In: 1335.5 [P.O.:240; I.V.:1000; IV Piggyback:95.5] Out: 100 [Urine:100] Intake/Output this shift: No intake/output data recorded.  General appearance: alert, cooperative, and no distress GI: mild RUQ tenderness  Lab Results:  Recent Labs    06/04/21 0032 06/05/21 0319  WBC 7.7 9.0  HGB 15.7 13.7  HCT 46.1 39.8  PLT 193 190   BMET Recent Labs    06/04/21 0032 06/05/21 0319  NA 132* 132*  K 3.5 3.1*  CL 93* 96*  CO2 21* 25  GLUCOSE 214* 156*  BUN 19 16  CREATININE 1.37* 1.29*  CALCIUM 9.3 8.7*   Hepatic Function Latest Ref Rng & Units 06/05/2021 06/04/2021  Total Protein 6.5 - 8.1 g/dL 6.4(L) 7.2  Albumin 3.5 - 5.0 g/dL 2.6(L) 3.2(L)  AST 15 - 41 U/L 178(H) 800(H)  ALT 0 - 44 U/L 256(H) 467(H)  Alk Phosphatase 38 - 126 U/L 247(H) 334(H)  Total Bilirubin 0.3 - 1.2 mg/dL 5.5(H) 4.0(H)    PT/INR No results for input(s): LABPROT, INR in the last 72 hours. ABG No results for input(s): PHART, HCO3 in the last 72 hours.  Invalid input(s): PCO2, PO2  Studies/Results: CT ABDOMEN PELVIS W CONTRAST  Result Date: 06/04/2021 CLINICAL DATA:  80 year old male with epigastric pain. Left lower quadrant pain. Abnormal LFTs. EXAM: CT ABDOMEN AND PELVIS WITH CONTRAST TECHNIQUE: Multidetector CT imaging of the abdomen and pelvis was performed using the standard protocol following bolus administration of intravenous contrast. CONTRAST:  15m OMNIPAQUE IOHEXOL 350 MG/ML SOLN COMPARISON:  Right upper quadrant ultrasound 0345 hours today.  FINDINGS: Lower chest: Patchy dependent and peribronchial opacity in both lungs more resembles atelectasis than infection. There is no pleural or pericardial effusion. Hepatobiliary: Cholelithiasis is evident by CT as demonstrated earlier today. But despite the fairly normal gallbladder wall thickness by earlier ultrasound there is pericholecystic inflammation demonstrated on series 3, image 33, coronal image 31, and the gallbladder wall appears indistinct. There is also inflammatory stranding in the mesentery of the adjacent transverse colon. There is subtle hyperenhancement of the liver parenchyma adjacent to the gallbladder fossa. No convincing bile duct enlargement. Elsewhere liver enhancement is normal. No free fluid. Pancreas: Negative. Spleen: Negative. Adrenals/Urinary Tract: Normal adrenal glands. Nonobstructed kidneys with symmetric renal enhancement and contrast excretion. Ureters are decompressed and normal. Negative bladder. Stomach/Bowel: Diverticulosis throughout a redundant sigmoid colon. Diverticulosis throughout the descending colon. Intermittent diverticula in the transverse colon. The transverse colon appears mildly inflamed adjacent to the gallbladder (coronal image 31). Diverticulosis also in the ascending colon. Cecum is distended with gas and stool. No other large bowel inflammation is identified. Normal retrocecal appendix on series 3, image 60. The cecum is on a lax mesentery located anteriorly. The terminal ileum is decompressed and negative. No dilated small bowel. Stomach and duodenum remain within normal limits. No free air or free fluid. Vascular/Lymphatic: Aortoiliac calcified atherosclerosis. Major arterial structures are patent. Portal venous system is patent. No lymphadenopathy. Reproductive: Negative. Other: No pelvic free fluid.  Numerous pelvic phleboliths. Musculoskeletal: Flowing endplate osteophytes resulting in interbody ankylosis  in the lower thoracic spine to L1. Lumbar  facet degeneration. No acute osseous abnormality identified. IMPRESSION: 1. Cholelithiasis with Pericholecystic Inflammation highly suspicious for Acute Cholecystitis despite the earlier Ultrasound. Mild secondary inflammation of the adjacent hepatic flexure. No free fluid or other complicating features. 2. Extensive diverticulosis of the large bowel, but no other large bowel inflammation identified. Normal appendix. 3. Patchy dependent and peribronchial opacity in both lungs more resembles atelectasis than infection. No pleural effusion. 4. Aortic Atherosclerosis (ICD10-I70.0). Electronically Signed   By: Genevie Ann M.D.   On: 06/04/2021 04:27   MR 3D Recon At Scanner  Result Date: 06/04/2021 CLINICAL DATA:  Suspected choledocholithiasis EXAM: MRI ABDOMEN WITHOUT AND WITH CONTRAST (INCLUDING MRCP) TECHNIQUE: Multiplanar multisequence MR imaging of the abdomen was performed both before and after the administration of intravenous contrast. Heavily T2-weighted images of the biliary and pancreatic ducts were obtained, and three-dimensional MRCP images were rendered by post processing. CONTRAST:  54m GADAVIST GADOBUTROL 1 MMOL/ML IV SOLN COMPARISON:  Same day CT of the abdomen and pelvis FINDINGS: Lower chest: No acute findings. Hepatobiliary: No suspicious liver lesions. Gallbladder is dilated and thick-walled with surrounding inflammatory change. Small stones and sludge are seen. Mildly dilated intrahepatic and common bile ducts. Common bile duct measures up to 7 mm. No choledocholithiasis. Pancreas: No mass, inflammatory changes, or other parenchymal abnormality identified. Spleen:  Within normal limits in size and appearance. Adrenals/Urinary Tract: No masses identified. No evidence of hydronephrosis. Stomach/Bowel: Visualized portions within the abdomen are unremarkable. Vascular/Lymphatic: No pathologically enlarged lymph nodes identified. No abdominal aortic aneurysm demonstrated. Other:  None. Musculoskeletal:  No suspicious bone lesions identified. IMPRESSION: Findings compatible with acute cholecystitis again seen. No evidence of choledocholithiasis. Electronically Signed   By: LYetta GlassmanM.D.   On: 06/04/2021 09:32   MR ABDOMEN MRCP W WO CONTAST  Result Date: 06/04/2021 CLINICAL DATA:  Suspected choledocholithiasis EXAM: MRI ABDOMEN WITHOUT AND WITH CONTRAST (INCLUDING MRCP) TECHNIQUE: Multiplanar multisequence MR imaging of the abdomen was performed both before and after the administration of intravenous contrast. Heavily T2-weighted images of the biliary and pancreatic ducts were obtained, and three-dimensional MRCP images were rendered by post processing. CONTRAST:  164mGADAVIST GADOBUTROL 1 MMOL/ML IV SOLN COMPARISON:  Same day CT of the abdomen and pelvis FINDINGS: Lower chest: No acute findings. Hepatobiliary: No suspicious liver lesions. Gallbladder is dilated and thick-walled with surrounding inflammatory change. Small stones and sludge are seen. Mildly dilated intrahepatic and common bile ducts. Common bile duct measures up to 7 mm. No choledocholithiasis. Pancreas: No mass, inflammatory changes, or other parenchymal abnormality identified. Spleen:  Within normal limits in size and appearance. Adrenals/Urinary Tract: No masses identified. No evidence of hydronephrosis. Stomach/Bowel: Visualized portions within the abdomen are unremarkable. Vascular/Lymphatic: No pathologically enlarged lymph nodes identified. No abdominal aortic aneurysm demonstrated. Other:  None. Musculoskeletal: No suspicious bone lesions identified. IMPRESSION: Findings compatible with acute cholecystitis again seen. No evidence of choledocholithiasis. Electronically Signed   By: LeYetta Glassman.D.   On: 06/04/2021 09:32   USKoreabdomen Limited RUQ (LIVER/GB)  Result Date: 06/04/2021 CLINICAL DATA:  Right upper quadrant abdominal pain EXAM: ULTRASOUND ABDOMEN LIMITED RIGHT UPPER QUADRANT COMPARISON:  None. FINDINGS:  Gallbladder: The gallbladder is mildly distended and contains layering sludge and small shadowing stones which appear mobile on decubitus positioning. There is no gallbladder wall thickening or pericholecystic fluid identified. The sonographic MuPercell Millerign is reportedly negative. Common bile duct: Diameter: 5 mm in proximal diameter. Liver: No focal lesion identified. Within  normal limits in parenchymal echogenicity. Portal vein is patent on color Doppler imaging with normal direction of blood flow towards the liver. Other: None. IMPRESSION: Cholelithiasis without sonographic evidence of acute cholecystitis. Electronically Signed   By: Fidela Salisbury M.D.   On: 06/04/2021 04:02    Anti-infectives: Anti-infectives (From admission, onward)    Start     Dose/Rate Route Frequency Ordered Stop   06/04/21 1400  piperacillin-tazobactam (ZOSYN) IVPB 3.375 g        3.375 g 12.5 mL/hr over 240 Minutes Intravenous Every 8 hours 06/04/21 0519     06/04/21 0445  piperacillin-tazobactam (ZOSYN) IVPB 3.375 g        3.375 g 100 mL/hr over 30 Minutes Intravenous  Once 06/04/21 0435 06/04/21 0538       Assessment/Plan: Acute cholecystitis Elevated t. Bili - negative MRCP  Plan laparoscopic cholecystectomy with intraoperative cholangiogram today.  The surgical procedure has been discussed with the patient.  Potential risks, benefits, alternative treatments, and expected outcomes have been explained.  All of the patient's questions at this time have been answered.  The likelihood of reaching the patient's treatment goal is good.  The patient understand the proposed surgical procedure and wishes to proceed.    LOS: 1 day    Maia Petties 06/05/2021

## 2021-06-05 NOTE — H&P (View-Only) (Signed)
Daily Rounding Note  06/05/2021, 12:45 PM  LOS: 1 day   SUBJECTIVE:   Chief complaint:   abnormal IOC  Pt went to lap chole this AM.   GB thickened, distended.  At [facility], "good visualization of the distal and proximal biliary tree, but contrast stopped in the distal common bile duct.  We administered Glucagon, waited three minutes, then repeated the cholangiogram.  Contrast never enters the duodenum". Pt currently c/o intense pain in RUQ, worse than yesterday.  No nausea currently.  He is thirsty  OBJECTIVE:         Vital signs in last 24 hours:    Temp:  [97.2 F (36.2 C)-98.8 F (37.1 C)] 97.2 F (36.2 C) (09/07 1145) Pulse Rate:  [60-104] 104 (09/07 1230) Resp:  [16-31] 26 (09/07 1230) BP: (91-148)/(51-95) 148/89 (09/07 1230) SpO2:  [89 %-96 %] 96 % (09/07 1230) Weight:  [111.1 kg] 111.1 kg (09/07 0930)   Filed Weights   06/04/21 0022 06/05/21 0930  Weight: 111.1 kg 111.1 kg   General: looks uncomfortable and ill.  alert   Heart: RRR Chest: no labored breathing Abdomen: distended but did not palpate abdomen or lift gown for inspection  Extremities: No CCE Neuro/Psych:  alert.  Not confused.  Moves all 4 limbs.    Intake/Output from previous day: 09/06 0701 - 09/07 0700 In: 1335.5 [P.O.:240; I.V.:1000; IV Piggyback:95.5] Out: 100 [Urine:100]  Intake/Output this shift: Total I/O In: 700 [I.V.:700] Out: 10 [Blood:10]  Lab Results: Recent Labs    06/04/21 0032 06/05/21 0319  WBC 7.7 9.0  HGB 15.7 13.7  HCT 46.1 39.8  PLT 193 190   BMET Recent Labs    06/04/21 0032 06/05/21 0319  NA 132* 132*  K 3.5 3.1*  CL 93* 96*  CO2 21* 25  GLUCOSE 214* 156*  BUN 19 16  CREATININE 1.37* 1.29*  CALCIUM 9.3 8.7*   LFT Recent Labs    06/04/21 0032 06/05/21 0319  PROT 7.2 6.4*  ALBUMIN 3.2* 2.6*  AST 800* 178*  ALT 467* 256*  ALKPHOS 334* 247*  BILITOT 4.0* 5.5*   PT/INR No results for input(s):  LABPROT, INR in the last 72 hours. Hepatitis Panel Recent Labs    06/04/21 0310  HEPBSAG NON REACTIVE  HCVAB NON REACTIVE  HEPAIGM NON REACTIVE  HEPBIGM NON REACTIVE    Studies/Results: DG Cholangiogram Operative  Result Date: 06/05/2021 CLINICAL DATA:  Lap chole with IOC. EXAM: INTRAOPERATIVE CHOLANGIOGRAM COMPARISON:  CT abdomen, MRI abdomen and ultrasound abdomen, 06/04/2021. FLUOROSCOPY TIME:  Fluoroscopy Time:  19 seconds Radiation Exposure Index (if provided by the fluoroscopic device): 12 mGy Number of Acquired Spot Images: 45, submitted FINDINGS: Multiple, oblique planar images of the RIGHT upper quadrant demonstrating instrumentation, cystic duct cannulation and antegrade cholangiogram. No intra- or extrahepatic biliary dilatation. No discrete filling defect identified. Cholecystectomy clips. IMPRESSION: Intra procedural fluoroscopic imaging for IOC. No discrete biliary filling defects identified. Please see performing service dictation for complete description of intra procedural findings. Electronically Signed   By: Michaelle Birks M.D.   On: 06/05/2021 11:24   CT ABDOMEN PELVIS W CONTRAST  Result Date: 06/04/2021 CLINICAL DATA:  80 year old male with epigastric pain. Left lower quadrant pain. Abnormal LFTs. EXAM: CT ABDOMEN AND PELVIS WITH CONTRAST TECHNIQUE: Multidetector CT imaging of the abdomen and pelvis was performed using the standard protocol following bolus administration of intravenous contrast. CONTRAST:  125m OMNIPAQUE IOHEXOL 350 MG/ML SOLN COMPARISON:  Right  upper quadrant ultrasound 0345 hours today. FINDINGS: Lower chest: Patchy dependent and peribronchial opacity in both lungs more resembles atelectasis than infection. There is no pleural or pericardial effusion. Hepatobiliary: Cholelithiasis is evident by CT as demonstrated earlier today. But despite the fairly normal gallbladder wall thickness by earlier ultrasound there is pericholecystic inflammation demonstrated on  series 3, image 33, coronal image 31, and the gallbladder wall appears indistinct. There is also inflammatory stranding in the mesentery of the adjacent transverse colon. There is subtle hyperenhancement of the liver parenchyma adjacent to the gallbladder fossa. No convincing bile duct enlargement. Elsewhere liver enhancement is normal. No free fluid. Pancreas: Negative. Spleen: Negative. Adrenals/Urinary Tract: Normal adrenal glands. Nonobstructed kidneys with symmetric renal enhancement and contrast excretion. Ureters are decompressed and normal. Negative bladder. Stomach/Bowel: Diverticulosis throughout a redundant sigmoid colon. Diverticulosis throughout the descending colon. Intermittent diverticula in the transverse colon. The transverse colon appears mildly inflamed adjacent to the gallbladder (coronal image 31). Diverticulosis also in the ascending colon. Cecum is distended with gas and stool. No other large bowel inflammation is identified. Normal retrocecal appendix on series 3, image 60. The cecum is on a lax mesentery located anteriorly. The terminal ileum is decompressed and negative. No dilated small bowel. Stomach and duodenum remain within normal limits. No free air or free fluid. Vascular/Lymphatic: Aortoiliac calcified atherosclerosis. Major arterial structures are patent. Portal venous system is patent. No lymphadenopathy. Reproductive: Negative. Other: No pelvic free fluid.  Numerous pelvic phleboliths. Musculoskeletal: Flowing endplate osteophytes resulting in interbody ankylosis in the lower thoracic spine to L1. Lumbar facet degeneration. No acute osseous abnormality identified. IMPRESSION: 1. Cholelithiasis with Pericholecystic Inflammation highly suspicious for Acute Cholecystitis despite the earlier Ultrasound. Mild secondary inflammation of the adjacent hepatic flexure. No free fluid or other complicating features. 2. Extensive diverticulosis of the large bowel, but no other large bowel  inflammation identified. Normal appendix. 3. Patchy dependent and peribronchial opacity in both lungs more resembles atelectasis than infection. No pleural effusion. 4. Aortic Atherosclerosis (ICD10-I70.0). Electronically Signed   By: Genevie Ann M.D.   On: 06/04/2021 04:27   MR 3D Recon At Scanner  Result Date: 06/04/2021 CLINICAL DATA:  Suspected choledocholithiasis EXAM: MRI ABDOMEN WITHOUT AND WITH CONTRAST (INCLUDING MRCP) TECHNIQUE: Multiplanar multisequence MR imaging of the abdomen was performed both before and after the administration of intravenous contrast. Heavily T2-weighted images of the biliary and pancreatic ducts were obtained, and three-dimensional MRCP images were rendered by post processing. CONTRAST:  69m GADAVIST GADOBUTROL 1 MMOL/ML IV SOLN COMPARISON:  Same day CT of the abdomen and pelvis FINDINGS: Lower chest: No acute findings. Hepatobiliary: No suspicious liver lesions. Gallbladder is dilated and thick-walled with surrounding inflammatory change. Small stones and sludge are seen. Mildly dilated intrahepatic and common bile ducts. Common bile duct measures up to 7 mm. No choledocholithiasis. Pancreas: No mass, inflammatory changes, or other parenchymal abnormality identified. Spleen:  Within normal limits in size and appearance. Adrenals/Urinary Tract: No masses identified. No evidence of hydronephrosis. Stomach/Bowel: Visualized portions within the abdomen are unremarkable. Vascular/Lymphatic: No pathologically enlarged lymph nodes identified. No abdominal aortic aneurysm demonstrated. Other:  None. Musculoskeletal: No suspicious bone lesions identified. IMPRESSION: Findings compatible with acute cholecystitis again seen. No evidence of choledocholithiasis. Electronically Signed   By: LYetta GlassmanM.D.   On: 06/04/2021 09:32   MR ABDOMEN MRCP W WO CONTAST  Result Date: 06/04/2021 CLINICAL DATA:  Suspected choledocholithiasis EXAM: MRI ABDOMEN WITHOUT AND WITH CONTRAST (INCLUDING  MRCP) TECHNIQUE: Multiplanar multisequence MR imaging of  the abdomen was performed both before and after the administration of intravenous contrast. Heavily T2-weighted images of the biliary and pancreatic ducts were obtained, and three-dimensional MRCP images were rendered by post processing. CONTRAST:  65m GADAVIST GADOBUTROL 1 MMOL/ML IV SOLN COMPARISON:  Same day CT of the abdomen and pelvis FINDINGS: Lower chest: No acute findings. Hepatobiliary: No suspicious liver lesions. Gallbladder is dilated and thick-walled with surrounding inflammatory change. Small stones and sludge are seen. Mildly dilated intrahepatic and common bile ducts. Common bile duct measures up to 7 mm. No choledocholithiasis. Pancreas: No mass, inflammatory changes, or other parenchymal abnormality identified. Spleen:  Within normal limits in size and appearance. Adrenals/Urinary Tract: No masses identified. No evidence of hydronephrosis. Stomach/Bowel: Visualized portions within the abdomen are unremarkable. Vascular/Lymphatic: No pathologically enlarged lymph nodes identified. No abdominal aortic aneurysm demonstrated. Other:  None. Musculoskeletal: No suspicious bone lesions identified. IMPRESSION: Findings compatible with acute cholecystitis again seen. No evidence of choledocholithiasis. Electronically Signed   By: LYetta GlassmanM.D.   On: 06/04/2021 09:32   [place]Abdomen Limited RUQ (LIVER/GB)  Result Date: 06/04/2021 CLINICAL DATA:  Right upper quadrant abdominal pain EXAM: ULTRASOUND ABDOMEN LIMITED RIGHT UPPER QUADRANT COMPARISON:  None. FINDINGS: Gallbladder: The gallbladder is mildly distended and contains layering sludge and small shadowing stones which appear mobile on decubitus positioning. There is no gallbladder wall thickening or pericholecystic fluid identified. The sonographic MPercell Millersign is reportedly negative. Common bile duct: Diameter: 5 mm in proximal diameter. Liver: No focal lesion identified. Within normal  limits in parenchymal echogenicity. Portal vein is patent on color Doppler imaging with normal direction of blood flow towards the liver. Other: None. IMPRESSION: Cholelithiasis without sonographic evidence of acute cholecystitis. Electronically Signed   By: AFidela SalisburyM.D.   On: 06/04/2021 04:02    ASSESMENT:     Cholelithiasis,  acute cholecystitis    Choledocholithiasis?.Marland Kitchen Not seen on yesterdays's  [facility]CP, [facility], c/w choledocholithiasis.  LFTs this AM, preop, much improved from 24 h prior.       Hypokalemia at 3.1.      AKI, mild.  Improved.     PLAN      ERCP  tmrw [id number]  Per d/w surgery PA, will order cler liquids and then NPO for ERCP.      PAS hose ordered as do not want pt receiving heparin/lovenox pre ERCP.  Continue Zosyn.      BMET in AM, may need potassium supplement.  Messaged hospitalist and surgical PA re low K level.      SAzucena Freed 06/05/2021, 12:45 PM Phone [phone]     Attending physician's note   I have taken an interval history, reviewed the chart and examined the patient. I agree with the Advanced Practitioner's note, impression and recommendations.   IOC reviewed dependently and with patient's wife.  There appears to be an obstruction in the distal CBD consistent with choledocholithiasis.  The contrast never enters the duodenum despite multiple films.  Would proceed with ERCP in AM. Continue antibiotics for now Trend LFTs, CBC D/W Dr TGeorgette Dover(thru secure chat)  I have explained risks and benefits including small but definite risks of pancreatitis (<10%), bleeding (<1%), perforation (<1%).  The risks of general anesthesia were also discussed.  The benefits were also discussed.  Patient wishes to proceed.  All the questions were answered.   RCarmell Austria MD LVelora HecklerGI [phone]

## 2021-06-05 NOTE — Progress Notes (Addendum)
Daily Rounding Note  06/05/2021, 12:45 PM  LOS: 1 day   SUBJECTIVE:   Chief complaint:   abnormal IOC  Pt went to lap chole this AM.   GB thickened, distended.  At [facility], "good visualization of the distal and proximal biliary tree, but contrast stopped in the distal common bile duct.  We administered Glucagon, waited three minutes, then repeated the cholangiogram.  Contrast never enters the duodenum". Pt currently c/o intense pain in RUQ, worse than yesterday.  No nausea currently.  He is thirsty  OBJECTIVE:         Vital signs in last 24 hours:    Temp:  [97.2 F (36.2 C)-98.8 F (37.1 C)] 97.2 F (36.2 C) (09/07 1145) Pulse Rate:  [60-104] 104 (09/07 1230) Resp:  [16-31] 26 (09/07 1230) BP: (91-148)/(51-95) 148/89 (09/07 1230) SpO2:  [89 %-96 %] 96 % (09/07 1230) Weight:  [111.1 kg] 111.1 kg (09/07 0930)   Filed Weights   06/04/21 0022 06/05/21 0930  Weight: 111.1 kg 111.1 kg   General: looks uncomfortable and ill.  alert   Heart: RRR Chest: no labored breathing Abdomen: distended but did not palpate abdomen or lift gown for inspection  Extremities: No CCE Neuro/Psych:  alert.  Not confused.  Moves all 4 limbs.    Intake/Output from previous day: 09/06 0701 - 09/07 0700 In: 1335.5 [P.O.:240; I.V.:1000; IV Piggyback:95.5] Out: 100 [Urine:100]  Intake/Output this shift: Total I/O In: 700 [I.V.:700] Out: 10 [Blood:10]  Lab Results: Recent Labs    06/04/21 0032 06/05/21 0319  WBC 7.7 9.0  HGB 15.7 13.7  HCT 46.1 39.8  PLT 193 190   BMET Recent Labs    06/04/21 0032 06/05/21 0319  NA 132* 132*  K 3.5 3.1*  CL 93* 96*  CO2 21* 25  GLUCOSE 214* 156*  BUN 19 16  CREATININE 1.37* 1.29*  CALCIUM 9.3 8.7*   LFT Recent Labs    06/04/21 0032 06/05/21 0319  PROT 7.2 6.4*  ALBUMIN 3.2* 2.6*  AST 800* 178*  ALT 467* 256*  ALKPHOS 334* 247*  BILITOT 4.0* 5.5*   PT/INR No results for input(s):  LABPROT, INR in the last 72 hours. Hepatitis Panel Recent Labs    06/04/21 0310  HEPBSAG NON REACTIVE  HCVAB NON REACTIVE  HEPAIGM NON REACTIVE  HEPBIGM NON REACTIVE    Studies/Results: DG Cholangiogram Operative  Result Date: 06/05/2021 CLINICAL DATA:  Lap chole with IOC. EXAM: INTRAOPERATIVE CHOLANGIOGRAM COMPARISON:  CT abdomen, MRI abdomen and ultrasound abdomen, 06/04/2021. FLUOROSCOPY TIME:  Fluoroscopy Time:  19 seconds Radiation Exposure Index (if provided by the fluoroscopic device): 12 mGy Number of Acquired Spot Images: 45, submitted FINDINGS: Multiple, oblique planar images of the RIGHT upper quadrant demonstrating instrumentation, cystic duct cannulation and antegrade cholangiogram. No intra- or extrahepatic biliary dilatation. No discrete filling defect identified. Cholecystectomy clips. IMPRESSION: Intra procedural fluoroscopic imaging for IOC. No discrete biliary filling defects identified. Please see performing service dictation for complete description of intra procedural findings. Electronically Signed   By: Michaelle Birks M.D.   On: 06/05/2021 11:24   CT ABDOMEN PELVIS W CONTRAST  Result Date: 06/04/2021 CLINICAL DATA:  80 year old male with epigastric pain. Left lower quadrant pain. Abnormal LFTs. EXAM: CT ABDOMEN AND PELVIS WITH CONTRAST TECHNIQUE: Multidetector CT imaging of the abdomen and pelvis was performed using the standard protocol following bolus administration of intravenous contrast. CONTRAST:  123m OMNIPAQUE IOHEXOL 350 MG/ML SOLN COMPARISON:  Right  upper quadrant ultrasound 0345 hours today. FINDINGS: Lower chest: Patchy dependent and peribronchial opacity in both lungs more resembles atelectasis than infection. There is no pleural or pericardial effusion. Hepatobiliary: Cholelithiasis is evident by CT as demonstrated earlier today. But despite the fairly normal gallbladder wall thickness by earlier ultrasound there is pericholecystic inflammation demonstrated on  series 3, image 33, coronal image 31, and the gallbladder wall appears indistinct. There is also inflammatory stranding in the mesentery of the adjacent transverse colon. There is subtle hyperenhancement of the liver parenchyma adjacent to the gallbladder fossa. No convincing bile duct enlargement. Elsewhere liver enhancement is normal. No free fluid. Pancreas: Negative. Spleen: Negative. Adrenals/Urinary Tract: Normal adrenal glands. Nonobstructed kidneys with symmetric renal enhancement and contrast excretion. Ureters are decompressed and normal. Negative bladder. Stomach/Bowel: Diverticulosis throughout a redundant sigmoid colon. Diverticulosis throughout the descending colon. Intermittent diverticula in the transverse colon. The transverse colon appears mildly inflamed adjacent to the gallbladder (coronal image 31). Diverticulosis also in the ascending colon. Cecum is distended with gas and stool. No other large bowel inflammation is identified. Normal retrocecal appendix on series 3, image 60. The cecum is on a lax mesentery located anteriorly. The terminal ileum is decompressed and negative. No dilated small bowel. Stomach and duodenum remain within normal limits. No free air or free fluid. Vascular/Lymphatic: Aortoiliac calcified atherosclerosis. Major arterial structures are patent. Portal venous system is patent. No lymphadenopathy. Reproductive: Negative. Other: No pelvic free fluid.  Numerous pelvic phleboliths. Musculoskeletal: Flowing endplate osteophytes resulting in interbody ankylosis in the lower thoracic spine to L1. Lumbar facet degeneration. No acute osseous abnormality identified. IMPRESSION: 1. Cholelithiasis with Pericholecystic Inflammation highly suspicious for Acute Cholecystitis despite the earlier Ultrasound. Mild secondary inflammation of the adjacent hepatic flexure. No free fluid or other complicating features. 2. Extensive diverticulosis of the large bowel, but no other large bowel  inflammation identified. Normal appendix. 3. Patchy dependent and peribronchial opacity in both lungs more resembles atelectasis than infection. No pleural effusion. 4. Aortic Atherosclerosis (ICD10-I70.0). Electronically Signed   By: Genevie Ann M.D.   On: 06/04/2021 04:27   MR 3D Recon At Scanner  Result Date: 06/04/2021 CLINICAL DATA:  Suspected choledocholithiasis EXAM: MRI ABDOMEN WITHOUT AND WITH CONTRAST (INCLUDING MRCP) TECHNIQUE: Multiplanar multisequence MR imaging of the abdomen was performed both before and after the administration of intravenous contrast. Heavily T2-weighted images of the biliary and pancreatic ducts were obtained, and three-dimensional MRCP images were rendered by post processing. CONTRAST:  19m GADAVIST GADOBUTROL 1 MMOL/ML IV SOLN COMPARISON:  Same day CT of the abdomen and pelvis FINDINGS: Lower chest: No acute findings. Hepatobiliary: No suspicious liver lesions. Gallbladder is dilated and thick-walled with surrounding inflammatory change. Small stones and sludge are seen. Mildly dilated intrahepatic and common bile ducts. Common bile duct measures up to 7 mm. No choledocholithiasis. Pancreas: No mass, inflammatory changes, or other parenchymal abnormality identified. Spleen:  Within normal limits in size and appearance. Adrenals/Urinary Tract: No masses identified. No evidence of hydronephrosis. Stomach/Bowel: Visualized portions within the abdomen are unremarkable. Vascular/Lymphatic: No pathologically enlarged lymph nodes identified. No abdominal aortic aneurysm demonstrated. Other:  None. Musculoskeletal: No suspicious bone lesions identified. IMPRESSION: Findings compatible with acute cholecystitis again seen. No evidence of choledocholithiasis. Electronically Signed   By: LYetta GlassmanM.D.   On: 06/04/2021 09:32   MR ABDOMEN MRCP W WO CONTAST  Result Date: 06/04/2021 CLINICAL DATA:  Suspected choledocholithiasis EXAM: MRI ABDOMEN WITHOUT AND WITH CONTRAST (INCLUDING  MRCP) TECHNIQUE: Multiplanar multisequence MR imaging of  the abdomen was performed both before and after the administration of intravenous contrast. Heavily T2-weighted images of the biliary and pancreatic ducts were obtained, and three-dimensional MRCP images were rendered by post processing. CONTRAST:  1m GADAVIST GADOBUTROL 1 MMOL/ML IV SOLN COMPARISON:  Same day CT of the abdomen and pelvis FINDINGS: Lower chest: No acute findings. Hepatobiliary: No suspicious liver lesions. Gallbladder is dilated and thick-walled with surrounding inflammatory change. Small stones and sludge are seen. Mildly dilated intrahepatic and common bile ducts. Common bile duct measures up to 7 mm. No choledocholithiasis. Pancreas: No mass, inflammatory changes, or other parenchymal abnormality identified. Spleen:  Within normal limits in size and appearance. Adrenals/Urinary Tract: No masses identified. No evidence of hydronephrosis. Stomach/Bowel: Visualized portions within the abdomen are unremarkable. Vascular/Lymphatic: No pathologically enlarged lymph nodes identified. No abdominal aortic aneurysm demonstrated. Other:  None. Musculoskeletal: No suspicious bone lesions identified. IMPRESSION: Findings compatible with acute cholecystitis again seen. No evidence of choledocholithiasis. Electronically Signed   By: LYetta GlassmanM.D.   On: 06/04/2021 09:32   UKoreaAbdomen Limited RUQ (LIVER/GB)  Result Date: 06/04/2021 CLINICAL DATA:  Right upper quadrant abdominal pain EXAM: ULTRASOUND ABDOMEN LIMITED RIGHT UPPER QUADRANT COMPARISON:  None. FINDINGS: Gallbladder: The gallbladder is mildly distended and contains layering sludge and small shadowing stones which appear mobile on decubitus positioning. There is no gallbladder wall thickening or pericholecystic fluid identified. The sonographic MPercell Millersign is reportedly negative. Common bile duct: Diameter: 5 mm in proximal diameter. Liver: No focal lesion identified. Within normal  limits in parenchymal echogenicity. Portal vein is patent on color Doppler imaging with normal direction of blood flow towards the liver. Other: None. IMPRESSION: Cholelithiasis without sonographic evidence of acute cholecystitis. Electronically Signed   By: AFidela SalisburyM.D.   On: 06/04/2021 04:02    ASSESMENT:     Cholelithiasis,  acute cholecystitis    Choledocholithiasis?.Marland Kitchen Not seen on yesterdays's  [facility], [facility] at distal CBD, c/w choledocholithiasis.  LFTs this AM, preop, much improved from 24 h prior.       Hypokalemia at 3.1.      AKI, mild.  Improved.     PLAN      ERCP  tmrw [id number]  Per d/w surgery PA, will order cler liquids and then NPO for ERCP.      PAS hose ordered as do not want pt receiving heparin/lovenox pre ERCP.  Continue Zosyn.      BMET in AM, may need potassium supplement.  Messaged hospitalist and surgical PA re low K level.      SAzucena Freed 06/05/2021, 12:45 PM Phone [phone]     Attending physician's note   I have taken an interval history, reviewed the chart and examined the patient. I agree with the Advanced Practitioner's note, impression and recommendations.   IOC reviewed dependently and with patient's wife.  There appears to be an obstruction in the distal CBD consistent with choledocholithiasis.  The contrast never enters the duodenum despite multiple films.  Would proceed with ERCP in AM. Continue antibiotics for now Trend LFTs, CBC D/W Dr TGeorgette Dover(thru secure chat)  I have explained risks and benefits including small but definite risks of pancreatitis (<10%), bleeding (<1%), perforation (<1%).  The risks of general anesthesia were also discussed.  The benefits were also discussed.  Patient wishes to proceed.  All the questions were answered.   RCarmell Austria MD LVelora HecklerGI [phone]

## 2021-06-05 NOTE — Op Note (Signed)
Laparoscopic Cholecystectomy with IOC Procedure Note  Indications: This patient presents with acute cholecystitis and elevated total bilirubin.  MRCP showed no sign of choledocholithiasis.  He will undergo laparoscopic cholecystectomy with cholangiogram.  Pre-operative Diagnosis: Calculus of gallbladder with acute cholecystitis, without mention of obstruction  Post-operative Diagnosis: Calculus of gallbladder with acute cholecystitis and obstruction  Surgeon: Maia Petties   Assistants: Richard Miu, PA-C  Anesthesia: General endotracheal anesthesia  ASA Class: 3  Procedure Details  The patient was seen again in the Holding Room. The risks, benefits, complications, treatment options, and expected outcomes were discussed with the patient. The possibilities of reaction to medication, pulmonary aspiration, perforation of viscus, bleeding, recurrent infection, finding a normal gallbladder, the need for additional procedures, failure to diagnose a condition, the possible need to convert to an open procedure, and creating a complication requiring transfusion or operation were discussed with the patient. The likelihood of improving the patient's symptoms with return to their baseline status is good.  The patient and/or family concurred with the proposed plan, giving informed consent. The site of surgery properly noted. The patient was taken to Operating Room, identified as Chad Oneill and the procedure verified as Laparoscopic Cholecystectomy with Intraoperative Cholangiogram. A Time Out was held and the above information confirmed.  Prior to the induction of general anesthesia, antibiotic prophylaxis was administered. General endotracheal anesthesia was then administered and tolerated well. After the induction, the abdomen was prepped with Chloraprep and draped in the sterile fashion. The patient was positioned in the supine position.  Local anesthetic agent was injected into the skin near the  umbilicus and an incision made. We dissected down to the abdominal fascia with blunt dissection.  The fascia was incised vertically and we entered the peritoneal cavity bluntly.  A pursestring suture of 0-Vicryl was placed around the fascial opening.  The Hasson cannula was inserted and secured with the stay suture.  Pneumoperitoneum was then created with CO2 and tolerated well without any adverse changes in the patient's vital signs. An 11-mm port was placed in the subxiphoid position.  Two 5-mm ports were placed in the right upper quadrant. All skin incisions were infiltrated with a local anesthetic agent before making the incision and placing the trocars.   We positioned the patient in reverse Trendelenburg, tilted slightly to the patient's left.  The gallbladder was identified.  The omentum was densely adherent to the surface of a very inflamed, thickened, distended gallbladder.  We decompressed the gallbladder with the suction aspirator.  The fundus was grasped and retracted cephalad. Adhesions were lysed bluntly and with the electrocautery where indicated, taking care not to injure any adjacent organs or viscus. The infundibulum was grasped and retracted laterally, exposing the peritoneum overlying the triangle of Calot. This was then divided and exposed in a blunt fashion. A critical view of the cystic duct and cystic artery was obtained.  The cystic duct was clearly identified and bluntly dissected circumferentially. The cystic duct was ligated with a clip distally.   An incision was made in the cystic duct and the [facility] cholangiogram catheter introduced. The catheter was secured using a clip. A cholangiogram was then obtained which showed good visualization of the distal and proximal biliary tree, but contrast stopped in the distal common bile duct.  We administered Glucagon, waited three minutes, then repeated the cholangiogram.  Contrast never enters the duodenum.  The catheter was then removed.   The  cystic duct was then ligated with clips and divided. The  cystic artery was identified, dissected free, ligated with clips and divided as well.   The gallbladder was dissected from the liver bed in retrograde fashion with the electrocautery. The gallbladder was removed and placed in an Ecosac. The liver bed was irrigated and inspected. Hemostasis was achieved with the electrocautery and SNOW. Copious irrigation was utilized and was repeatedly aspirated until clear.  The gallbladder and Eco sac were then removed through the umbilical port site.  The pursestring suture was used to close the umbilical fascia.    We again inspected the right upper quadrant for hemostasis.  Pneumoperitoneum was released as we removed the trocars.  4-0 Monocryl was used to close the skin.   Benzoin, steri-strips, and clean dressings were applied. The patient was then extubated and brought to the recovery room in stable condition. Instrument, sponge, and needle counts were correct at closure and at the conclusion of the case.   Findings: Cholecystitis with Cholelithiasis; obstructed common bile duct  Estimated Blood Loss: less than 50 mL         Drains: none         Specimens: Gallbladder           Complications: None; patient tolerated the procedure well.         Disposition: PACU - hemodynamically stable.         Condition: stable  Imogene Burn. Georgette Dover, MD, [facility] Surgery  General Surgery   06/05/2021 11:32 AM

## 2021-06-05 NOTE — Transfer of Care (Signed)
Immediate Anesthesia Transfer of Care Note  Patient: Chad Oneill  Procedure(s) Performed: LAPAROSCOPIC CHOLECYSTECTOMY INTRAOPERATIVE CHOLANGIOGRAM  Patient Location: PACU  Anesthesia Type:General  Level of Consciousness: awake and confused  Airway & Oxygen Therapy: Patient Spontanous Breathing and Patient connected to face mask oxygen  Post-op Assessment: Report given to RN and Post -op Vital signs reviewed and stable  Post vital signs: Reviewed and stable  Last Vitals:  Vitals Value Taken Time  BP 137/89 06/05/21 1145  Temp    Pulse 102 06/05/21 1150  Resp 24 06/05/21 1150  SpO2 86 % 06/05/21 1150  Vitals shown include unvalidated device data.  Last Pain:  Vitals:   06/05/21 0445  TempSrc: Oral  PainSc:          Complications: No notable events documented.

## 2021-06-05 NOTE — Anesthesia Postprocedure Evaluation (Signed)
Anesthesia Post Note  Patient: Chad Oneill  Procedure(s) Performed: LAPAROSCOPIC CHOLECYSTECTOMY INTRAOPERATIVE CHOLANGIOGRAM     Patient location during evaluation: PACU Anesthesia Type: General Level of consciousness: awake and alert Pain management: pain level controlled Vital Signs Assessment: post-procedure vital signs reviewed and stable Respiratory status: spontaneous breathing, nonlabored ventilation, respiratory function stable and patient connected to nasal cannula oxygen Cardiovascular status: blood pressure returned to baseline and stable Postop Assessment: no apparent nausea or vomiting Anesthetic complications: no   No notable events documented.  Last Vitals:  Vitals:   06/05/21 1245 06/05/21 1325  BP: (!) 143/93 (!) 151/87  Pulse: (!) 106 98  Resp: (!) 30 20  Temp:  37.1 C  SpO2: 91% 95%    Last Pain:  Vitals:   06/05/21 1325  TempSrc: Oral  PainSc:                  Merlinda Frederick

## 2021-06-05 NOTE — Progress Notes (Signed)
PROGRESS NOTE  Chad Oneill [id number] DOB: 05-15-1941 DOA: 06/04/2021 PCP: Prince Solian, MD  HPI/Recap of past 24 hours: Patient is a pleasant 80 year old gentleman history of type 2 diabetes, hypertension, hyperlipidemia presented to the ED with epigastric abdominal pain.  Lab work obtained with a significant transaminitis.  CT abdomen and pelvis done suspicious for an acute cholecystitis.  Right upper quadrant ultrasound done with cholelithiasis without sonographic evidence of acute cholecystitis.  Acute hepatitis panel negative.  Patient placed empirically on IV Zosyn.  Continue hydration with IV fluids General surgery consulted who ordered MRCP and recommended GI evaluation.  GI and general surgery consulted.  Plan for lap chole on 06/05/2021.  06/05/2021: Patient seen prior to his lap chole.  He had no new complaints.  Assessment/Plan: Principal Problem:   Acute cholecystitis Active Problems:   Transaminitis   DM2 (diabetes mellitus, type 2) (HCC)   HTN (hypertension)  Acute cholecystitis with cholelithiasis and obstructive common bile duct. Post lap chole on 06/05/2021 by Dr. Georgette Dover MRCP from 06/04/2021 did not show evidence of choledocholithiasis however at [facility] on 06/05/2021, contrast stopped at distal CBD consistent with choledocholithiasis. GI consulted and following. DM2 - Hold home meds Sensitive SSI Q4H while NPO Resume oral intake when okay with general surgery. HTN - BP is not at goal, resume home oral antihypertensives. Closely monitor vital signs.   DVT prophylaxis: SCDs Code Status: Full Family Communication: Wife at bedside Disposition Plan: Home after cleared by gen surg and GI Consults called: EDP spoke with Dr. Zenia Resides, I sent message to Dr. Bryan Lemma Admission status: Admit to inpatient   Code Status: Full code  Family Communication: None at bedside  Disposition Plan: Home when cleared by general surgery and GI.   Consultants: General  surgery GI.  Procedures: Lap chole on 06/05/2021.  Antimicrobials: IV Zosyn  DVT prophylaxis: SCDs  Status is: Observation    Dispo: The patient is from: Home.               Anticipated d/c is to: Home.               Patient currently not stable for discharge, postsurgery recovery.   Difficult to place patient, not applicable.         Objective: Vitals:   06/05/21 1215 06/05/21 1230 06/05/21 1245 06/05/21 1325  BP: (!) 144/92 (!) 148/89 (!) 143/93 (!) 151/87  Pulse: (!) 103 (!) 104 (!) 106 98  Resp: (!) 31 (!) 26 (!) 30 20  Temp:    98.7 F (37.1 C)  TempSrc:    Oral  SpO2: 94% 96% 91% 95%  Weight:      Height:        Intake/Output Summary (Last 24 hours) at 06/05/2021 1538 Last data filed at 06/05/2021 1103 Gross per 24 hour  Intake 2035.51 ml  Output 110 ml  Net 1925.51 ml   Filed Weights   06/04/21 0022 06/05/21 0930  Weight: 111.1 kg 111.1 kg    Exam:  General: 79 y.o. year-old male well developed well nourished in no acute distress.  Alert and oriented x3. Cardiovascular: Regular rate and rhythm with no rubs or gallops.  No thyromegaly or JVD noted.   Respiratory: Clear to auscultation with no wheezes or rales. Good inspiratory effort. Abdomen: Soft nontender nondistended with normal bowel sounds x4 quadrants. Musculoskeletal: No lower extremity edema. 2/4 pulses in all 4 extremities. Skin: No ulcerative lesions noted or rashes, Psychiatry: Mood is appropriate for condition and setting  Data Reviewed: CBC: Recent Labs  Lab 06/04/21 0032 06/05/21 0319  WBC 7.7 9.0  HGB 15.7 13.7  HCT 46.1 39.8  MCV 96.0 93.0  PLT 193 [id number]   Basic Metabolic Panel: Recent Labs  Lab 06/04/21 0032 06/05/21 0319  NA 132* 132*  K 3.5 3.1*  CL 93* 96*  CO2 21* 25  GLUCOSE 214* 156*  BUN 19 16  CREATININE 1.37* 1.29*  CALCIUM 9.3 8.7*  MG  --  1.5*   GFR: Estimated Creatinine Clearance: 60.7 mL/min (A) (by C-G formula based on SCr of 1.29 mg/dL  (H)). Liver Function Tests: Recent Labs  Lab 06/04/21 0032 06/05/21 0319  AST 800* 178*  ALT 467* 256*  ALKPHOS 334* 247*  BILITOT 4.0* 5.5*  PROT 7.2 6.4*  ALBUMIN 3.2* 2.6*   Recent Labs  Lab 06/04/21 0032  LIPASE 27   No results for input(s): AMMONIA in the last 168 hours. Coagulation Profile: No results for input(s): INR, PROTIME in the last 168 hours. Cardiac Enzymes: No results for input(s): CKTOTAL, CKMB, CKMBINDEX, TROPONINI in the last 168 hours. BNP (last 3 results) No results for input(s): PROBNP in the last 8760 hours. HbA1C: Recent Labs    06/04/21 0513  HGBA1C 7.0*   CBG: Recent Labs  Lab 06/05/21 0431 06/05/21 0751 06/05/21 0903 06/05/21 0931 06/05/21 1146  GLUCAP 196* 155* 151* 139* 184*   Lipid Profile: No results for input(s): CHOL, HDL, LDLCALC, TRIG, CHOLHDL, LDLDIRECT in the last 72 hours. Thyroid Function Tests: No results for input(s): TSH, T4TOTAL, FREET4, T3FREE, THYROIDAB in the last 72 hours. Anemia Panel: No results for input(s): VITAMINB12, FOLATE, FERRITIN, TIBC, IRON, RETICCTPCT in the last 72 hours. Urine analysis:    Component Value Date/Time   COLORURINE AMBER (A) 06/04/2021 0026   APPEARANCEUR HAZY (A) 06/04/2021 0026   LABSPEC 1.026 06/04/2021 0026   PHURINE 5.0 06/04/2021 0026   GLUCOSEU >=500 (A) 06/04/2021 0026   HGBUR SMALL (A) 06/04/2021 0026   BILIRUBINUR NEGATIVE 06/04/2021 0026   KETONESUR 20 (A) 06/04/2021 0026   PROTEINUR 30 (A) 06/04/2021 0026   NITRITE NEGATIVE 06/04/2021 0026   LEUKOCYTESUR NEGATIVE 06/04/2021 0026   Sepsis Labs: '@LABRCNTIP'$ (procalcitonin:4,lacticidven:4)  ) Recent Results (from the past 240 hour(s))  Resp Panel by RT-PCR (Flu A&B, Covid) Nasopharyngeal Swab     Status: None   Collection Time: 06/04/21  4:37 AM   Specimen: Nasopharyngeal Swab; Nasopharyngeal(NP) swabs in vial transport medium  Result Value Ref Range Status   SARS Coronavirus 2 by RT PCR NEGATIVE NEGATIVE Final     Comment: (NOTE) SARS-CoV-2 target nucleic acids are NOT DETECTED.  The SARS-CoV-2 RNA is generally detectable in upper respiratory specimens during the acute phase of infection. The lowest concentration of SARS-CoV-2 viral copies this assay can detect is 138 copies/mL. A negative result does not preclude SARS-Cov-2 infection and should not be used as the sole basis for treatment or other patient management decisions. A negative result may occur with  improper specimen collection/handling, submission of specimen other than nasopharyngeal swab, presence of viral mutation(s) within the areas targeted by this assay, and inadequate number of viral copies(<138 copies/mL). A negative result must be combined with clinical observations, patient history, and epidemiological information. The expected result is Negative.  Fact Sheet for Patients:  EntrepreneurPulse.com.au  Fact Sheet for Healthcare Providers:  IncredibleEmployment.be  This test is no t yet approved or cleared by the [place] FDA and  has been authorized for detection and/or diagnosis of SARS-CoV-2 by  FDA under an Emergency Use Authorization (EUA). This EUA will remain  in effect (meaning this test can be used) for the duration of the COVID-19 declaration under Section 564(b)(1) of the Act, 21 U.S.C.section 360bbb-3(b)(1), unless the authorization is terminated  or revoked sooner.       Influenza A by PCR NEGATIVE NEGATIVE Final   Influenza B by PCR NEGATIVE NEGATIVE Final    Comment: (NOTE) The Xpert Xpress SARS-CoV-2/FLU/RSV plus assay is intended as an aid in the diagnosis of influenza from Nasopharyngeal swab specimens and should not be used as a sole basis for treatment. Nasal washings and aspirates are unacceptable for Xpert Xpress SARS-CoV-2/FLU/RSV testing.  Fact Sheet for Patients: EntrepreneurPulse.com.au  Fact Sheet for Healthcare  Providers: IncredibleEmployment.be  This test is not yet approved or cleared by the [place] FDA and has been authorized for detection and/or diagnosis of SARS-CoV-2 by FDA under an Emergency Use Authorization (EUA). This EUA will remain in effect (meaning this test can be used) for the duration of the COVID-19 declaration under Section 564(b)(1) of the Act, 21 U.S.C. section 360bbb-3(b)(1), unless the authorization is terminated or revoked.  Performed at [facility], [place] [address], [place], [place] [address]       Studies: DG Cholangiogram Operative  Result Date: 06/05/2021 CLINICAL DATA:  Lap chole with IOC. EXAM: INTRAOPERATIVE CHOLANGIOGRAM COMPARISON:  CT abdomen, MRI abdomen and ultrasound abdomen, 06/04/2021. FLUOROSCOPY TIME:  Fluoroscopy Time:  19 seconds Radiation Exposure Index (if provided by the fluoroscopic device): 12 mGy Number of Acquired Spot Images: 45, submitted FINDINGS: Multiple, oblique planar images of the RIGHT upper quadrant demonstrating instrumentation, cystic duct cannulation and antegrade cholangiogram. No intra- or extrahepatic biliary dilatation. No discrete filling defect identified. Cholecystectomy clips. IMPRESSION: Intra procedural fluoroscopic imaging for IOC. No discrete biliary filling defects identified. Please see performing service dictation for complete description of intra procedural findings. Electronically Signed   By: Michaelle Birks M.D.   On: 06/05/2021 11:24    Scheduled Meds:  chlorhexidine       docusate sodium  100 mg Oral BID   fentaNYL       fentaNYL       insulin aspart  0-9 Units Subcutaneous Q4H    Continuous Infusions:  lactated ringers 75 mL/hr at 06/05/21 1353   piperacillin-tazobactam (ZOSYN)  IV 3.375 g (06/05/21 1359)     LOS: 1 day     Kayleen Memos, MD [facility] Pager [phone]  If 7PM-7AM, please contact night-coverage [email or web address] Password [facility] 06/05/2021, 3:38 PM

## 2021-06-06 ENCOUNTER — Encounter (HOSPITAL_COMMUNITY): Payer: Self-pay | Admitting: Surgery

## 2021-06-06 ENCOUNTER — Encounter (HOSPITAL_COMMUNITY)
Admission: EM | Disposition: A | Discharge status: Home / Self Care | Payer: Self-pay | Source: Home / Self Care | Attending: Internal Medicine

## 2021-06-06 ENCOUNTER — Inpatient Hospital Stay (HOSPITAL_COMMUNITY): Payer: Medicare Other

## 2021-06-06 ENCOUNTER — Inpatient Hospital Stay (HOSPITAL_COMMUNITY): Payer: Medicare Other | Admitting: Certified Registered"

## 2021-06-06 DIAGNOSIS — I10 Essential (primary) hypertension: Secondary | ICD-10-CM | POA: Diagnosis present

## 2021-06-06 DIAGNOSIS — F419 Anxiety disorder, unspecified: Secondary | ICD-10-CM | POA: Diagnosis present

## 2021-06-06 DIAGNOSIS — Z87891 Personal history of nicotine dependence: Secondary | ICD-10-CM | POA: Diagnosis not present

## 2021-06-06 DIAGNOSIS — Z8611 Personal history of tuberculosis: Secondary | ICD-10-CM | POA: Diagnosis not present

## 2021-06-06 DIAGNOSIS — K8043 Calculus of bile duct with acute cholecystitis with obstruction: Secondary | ICD-10-CM | POA: Diagnosis present

## 2021-06-06 DIAGNOSIS — Z7982 Long term (current) use of aspirin: Secondary | ICD-10-CM | POA: Diagnosis not present

## 2021-06-06 DIAGNOSIS — N179 Acute kidney failure, unspecified: Secondary | ICD-10-CM | POA: Diagnosis present

## 2021-06-06 DIAGNOSIS — R1013 Epigastric pain: Secondary | ICD-10-CM | POA: Diagnosis present

## 2021-06-06 DIAGNOSIS — E119 Type 2 diabetes mellitus without complications: Secondary | ICD-10-CM | POA: Diagnosis present

## 2021-06-06 DIAGNOSIS — E876 Hypokalemia: Secondary | ICD-10-CM | POA: Diagnosis not present

## 2021-06-06 DIAGNOSIS — K8050 Calculus of bile duct without cholangitis or cholecystitis without obstruction: Secondary | ICD-10-CM | POA: Diagnosis not present

## 2021-06-06 DIAGNOSIS — Z85828 Personal history of other malignant neoplasm of skin: Secondary | ICD-10-CM | POA: Diagnosis not present

## 2021-06-06 DIAGNOSIS — E785 Hyperlipidemia, unspecified: Secondary | ICD-10-CM | POA: Diagnosis present

## 2021-06-06 DIAGNOSIS — K810 Acute cholecystitis: Secondary | ICD-10-CM | POA: Diagnosis not present

## 2021-06-06 DIAGNOSIS — Z20822 Contact with and (suspected) exposure to covid-19: Secondary | ICD-10-CM | POA: Diagnosis present

## 2021-06-06 DIAGNOSIS — Z79899 Other long term (current) drug therapy: Secondary | ICD-10-CM | POA: Diagnosis not present

## 2021-06-06 DIAGNOSIS — Z7984 Long term (current) use of oral hypoglycemic drugs: Secondary | ICD-10-CM | POA: Diagnosis not present

## 2021-06-06 HISTORY — PX: REMOVAL OF STONES: SHX5545

## 2021-06-06 HISTORY — PX: ENDOSCOPIC RETROGRADE CHOLANGIOPANCREATOGRAPHY (ERCP) WITH PROPOFOL: SHX5810

## 2021-06-06 HISTORY — PX: SPHINCTEROTOMY: SHX5544

## 2021-06-06 LAB — COMPREHENSIVE METABOLIC PANEL
ALT: 188 U/L — ABNORMAL HIGH (ref 0–44)
AST: 98 U/L — ABNORMAL HIGH (ref 15–41)
Albumin: 2.4 g/dL — ABNORMAL LOW (ref 3.5–5.0)
Alkaline Phosphatase: 217 U/L — ABNORMAL HIGH (ref 38–126)
Anion gap: 10 (ref 5–15)
BUN: 15 mg/dL (ref 8–23)
CO2: 27 mmol/L (ref 22–32)
Calcium: 9 mg/dL (ref 8.9–10.3)
Chloride: 97 mmol/L — ABNORMAL LOW (ref 98–111)
Creatinine, Ser: 1.1 mg/dL (ref 0.61–1.24)
GFR, Estimated: >60 mL/min (ref >60)
Glucose, Bld: 169 mg/dL — ABNORMAL HIGH (ref 70–99)
Potassium: 4.2 mmol/L (ref 3.5–5.1)
Sodium: 134 mmol/L — ABNORMAL LOW (ref 135–145)
Total Bilirubin: 3.5 mg/dL — ABNORMAL HIGH (ref 0.3–1.2)
Total Protein: 6.3 g/dL — ABNORMAL LOW (ref 6.5–8.1)

## 2021-06-06 LAB — CBC
HCT: 45.3 % (ref 39.0–52.0)
Hemoglobin: 15 g/dL (ref 13.0–17.0)
MCH: 31.6 pg (ref 26.0–34.0)
MCHC: 33.1 g/dL (ref 30.0–36.0)
MCV: 95.6 fL (ref 80.0–100.0)
Platelets: 161 10*3/uL (ref 150–400)
RBC: 4.74 MIL/uL (ref 4.22–5.81)
RDW: 14 % (ref 11.5–15.5)
WBC: 9.5 10*3/uL (ref 4.0–10.5)
nRBC: 0 % (ref 0.0–0.2)

## 2021-06-06 LAB — GLUCOSE, CAPILLARY
Glucose-Capillary: 149 mg/dL — ABNORMAL HIGH (ref 70–99)
Glucose-Capillary: 156 mg/dL — ABNORMAL HIGH (ref 70–99)
Glucose-Capillary: 157 mg/dL — ABNORMAL HIGH (ref 70–99)
Glucose-Capillary: 182 mg/dL — ABNORMAL HIGH (ref 70–99)
Glucose-Capillary: 195 mg/dL — ABNORMAL HIGH (ref 70–99)
Glucose-Capillary: 198 mg/dL — ABNORMAL HIGH (ref 70–99)
Glucose-Capillary: 220 mg/dL — ABNORMAL HIGH (ref 70–99)

## 2021-06-06 SURGERY — ENDOSCOPIC RETROGRADE CHOLANGIOPANCREATOGRAPHY (ERCP) WITH PROPOFOL
Anesthesia: General

## 2021-06-06 MED ORDER — DEXAMETHASONE SODIUM PHOSPHATE 10 MG/ML IJ SOLN
INTRAMUSCULAR | Status: DC | PRN
Start: 2021-06-06 — End: 2021-06-06
  Administered 2021-06-06: 10 mg via INTRAVENOUS

## 2021-06-06 MED ORDER — GLUCAGON HCL RDNA (DIAGNOSTIC) 1 MG IJ SOLR
INTRAMUSCULAR | Status: AC
  Filled 2021-06-06: qty 1

## 2021-06-06 MED ORDER — INDOMETHACIN 50 MG RE SUPP
RECTAL | Status: AC
  Filled 2021-06-06: qty 2

## 2021-06-06 MED ORDER — ONDANSETRON HCL 4 MG/2ML IJ SOLN
INTRAMUSCULAR | Status: DC | PRN
  Administered 2021-06-06: 4 mg via INTRAVENOUS

## 2021-06-06 MED ORDER — INDOMETHACIN 50 MG RE SUPP
RECTAL | Status: DC | PRN
  Administered 2021-06-06: 100 mg via RECTAL

## 2021-06-06 MED ORDER — LIDOCAINE 2% (20 MG/ML) 5 ML SYRINGE
INTRAMUSCULAR | Status: DC | PRN
  Administered 2021-06-06: 100 mg via INTRAVENOUS

## 2021-06-06 MED ORDER — METOPROLOL TARTRATE 5 MG/5ML IV SOLN
INTRAVENOUS | Status: AC
  Filled 2021-06-06: qty 5

## 2021-06-06 MED ORDER — SODIUM CHLORIDE 0.9 % IV SOLN
INTRAVENOUS | Status: DC | PRN
  Administered 2021-06-06: 50 mL

## 2021-06-06 MED ORDER — FENTANYL CITRATE (PF) 250 MCG/5ML IJ SOLN
INTRAMUSCULAR | Status: DC | PRN
  Administered 2021-06-06 (×2): 50 ug via INTRAVENOUS

## 2021-06-06 MED ORDER — GLUCAGON HCL RDNA (DIAGNOSTIC) 1 MG IJ SOLR
INTRAMUSCULAR | Status: DC | PRN
  Administered 2021-06-06 (×3): .25 mg via INTRAVENOUS

## 2021-06-06 MED ORDER — PROPOFOL 10 MG/ML IV BOLUS
INTRAVENOUS | Status: DC | PRN
  Administered 2021-06-06: 120 mg via INTRAVENOUS

## 2021-06-06 MED ORDER — PHENYLEPHRINE HCL-NACL 20-0.9 MG/250ML-% IV SOLN
INTRAVENOUS | Status: DC | PRN
  Administered 2021-06-06: 30 ug/min via INTRAVENOUS

## 2021-06-06 MED ORDER — SUCCINYLCHOLINE CHLORIDE 200 MG/10ML IV SOSY
PREFILLED_SYRINGE | INTRAVENOUS | Status: DC | PRN
  Administered 2021-06-06: 140 mg via INTRAVENOUS

## 2021-06-06 MED ORDER — ACETAMINOPHEN 325 MG PO TABS
650.0000 mg | ORAL_TABLET | Freq: Four times a day (QID) | ORAL | Status: DC
  Administered 2021-06-06 – 2021-06-07 (×5): 650 mg via ORAL
  Filled 2021-06-06 (×5): qty 2

## 2021-06-06 NOTE — Op Note (Signed)
[facility] Patient Name: Chad Oneill Procedure Date : 06/06/2021 MRN: [id number] Attending MD: Jackquline Denmark , MD Date of Birth: [birth year] CSN: [id number] Age: 80 Admit Type: Inpatient Procedure:                ERCP Indications:              Bile duct stone/obstruction on IOC with abn LFTs. Providers:                Jackquline Denmark, MD, Dulcy Fanny, Cherylynn Ridges,                            Technician, Cletis Athens, Technician Referring MD:             DR Verita Lamb Medicines:                General Anesthesia, IV Zosyn, glucagon, Indocin                            suppositories. Complications:            No immediate complications. Estimated Blood Loss:     Estimated blood loss: none. Procedure:                Pre-Anesthesia Assessment:                           - Prior to the procedure, a History and Physical                            was performed, and patient medications and                            allergies were reviewed. The patient's tolerance of                            previous anesthesia was also reviewed. The risks                            and benefits of the procedure and the sedation                            options and risks were discussed with the patient.                            All questions were answered, and informed consent                            was obtained. Prior Anticoagulants: The patient has                            taken no previous anticoagulant or antiplatelet                            agents. ASA Grade Assessment: III - A patient with  severe systemic disease. After reviewing the risks                            and benefits, the patient was deemed in                            satisfactory condition to undergo the procedure.                           After obtaining informed consent, the scope was                            passed under direct vision. Throughout the                             procedure, the patient's blood pressure, pulse, and                            oxygen saturations were monitored continuously. The                            [employer] single use                            duodenoscope was introduced through the mouth, and                            used to inject contrast into and used to inject                            contrast into the bile duct. The ERCP was                            accomplished without difficulty. The patient                            tolerated the procedure well. Scope In: Scope Out: Findings:      A scout film of the abdomen was obtained. Surgical clips, consistent       with a previous cholecystectomy, were seen in the area of the right       upper quadrant of the abdomen. The esophagus was successfully intubated       under direct vision. The scope was advanced to a normal major papilla in       the descending duodenum without detailed examination of the pharynx,       larynx and associated structures, and upper GI tract. The upper GI tract       was grossly normal. The bile duct was deeply cannulated with the       short-nosed traction sphincterotome. Contrast was injected. I personally       interpreted the bile duct images. Ductal flow of contrast was adequate.       Image quality was adequate. Contrast extended to the entire biliary tree.      Small filling defects were noted in the distal CBD c/w  choledocholithiasis. CBD was mildly dilated measuring 10 mm. Common       hepatic duct, right and left hepatic ducts were mildly dilated. Cystic       duct remnant did fill. There were no leaks.      A 8 mm biliary sphincterotomy was made with a monofilament traction       (standard) sphincterotome using ERBE electrocautery on endocut mode at       11 o'clock position. There was no post-sphincterotomy bleeding. A small       stone and sludge came out after sphincterotomy. The biliary tree was        swept with a 12 mm balloon starting at the bifurcation several times       followed by postocclusion cholangiogram. There were no residual filling       defects.      Pancreatic duct was never injected or cannulated. Impression:               - Choledocholithiasis s/p biliary sphincterotomy                            and balloon extraction.                           - Previous cholecystectomy. Recommendation:           - Return patient to hospital ward for ongoing care.                           - Clear liquid diet today. Advance to low-fat diet                            in AM.                           - Trend CBC, LFTs.                           - Watch for pancreatitis, bleeding, perforation,                            and cholangitis.                           - The findings and recommendations were discussed                            with the patient's family. Procedure Code(s):        --- Professional ---                           [phone], Endoscopic retrograde                            cholangiopancreatography (ERCP); with removal of                            calculi/debris from biliary/pancreatic duct(s)  [id number], Endoscopic retrograde                            cholangiopancreatography (ERCP); with                            sphincterotomy/papillotomy                           [phone], Endoscopic catheterization of the biliary                            ductal system, radiological supervision and                            interpretation Diagnosis Code(s):        --- Professional ---                           K80.50, Calculus of bile duct without cholangitis                            or cholecystitis without obstruction CPT copyright 2019 American Medical Association. All rights reserved. The codes documented in this report are preliminary and upon coder review may  be revised to meet current compliance requirements. Jackquline Denmark, MD 06/06/2021 3:45:50  PM This report has been signed electronically. Number of Addenda: 0

## 2021-06-06 NOTE — Transfer of Care (Signed)
Immediate Anesthesia Transfer of Care Note  Patient: Chad Oneill  Procedure(s) Performed: ENDOSCOPIC RETROGRADE CHOLANGIOPANCREATOGRAPHY (ERCP) WITH PROPOFOL SPHINCTEROTOMY REMOVAL OF STONES  Patient Location: Endoscopy Unit  Anesthesia Type:General  Level of Consciousness: awake, alert  and oriented  Airway & Oxygen Therapy: Patient Spontanous Breathing and Patient connected to face mask oxygen  Post-op Assessment: Report given to RN and Post -op Vital signs reviewed and stable  Post vital signs: Reviewed and stable  Last Vitals:  Vitals Value Taken Time  BP 132/78   Temp    Pulse 84   Resp 14   SpO2 95%     Last Pain:  Vitals:   06/06/21 1345  TempSrc: Temporal  PainSc: 2          Complications: No notable events documented.

## 2021-06-06 NOTE — Anesthesia Procedure Notes (Signed)
Procedure Name: Intubation Date/Time: 06/06/2021 2:42 PM Performed by: Amadeo Garnet, CRNA Pre-anesthesia Checklist: Patient identified, Emergency Drugs available, Suction available and Patient being monitored Patient Re-evaluated:Patient Re-evaluated prior to induction Oxygen Delivery Method: Circle system utilized Preoxygenation: Pre-oxygenation with 100% oxygen Induction Type: IV induction Ventilation: Mask ventilation without difficulty and Oral airway inserted - appropriate to patient size Laryngoscope Size: Sabra Heck and 2 Grade View: Grade I Tube type: Oral Tube size: 7.5 mm Number of attempts: 1 Airway Equipment and Method: Stylet and Oral airway Placement Confirmation: ETT inserted through vocal cords under direct vision, positive ETCO2 and breath sounds checked- equal and bilateral Secured at: 23 cm Tube secured with: Tape Dental Injury: Teeth and Oropharynx as per pre-operative assessment

## 2021-06-06 NOTE — Progress Notes (Signed)
PROGRESS NOTE  Chad Oneill [id number] DOB: [birth year] DOA: 06/04/2021 PCP: Prince Solian, MD  HPI/Recap of past 24 hours: Patient is a pleasant 80 year old gentleman history of type 2 diabetes, hypertension, hyperlipidemia presented to the ED with epigastric abdominal pain.  Lab work obtained with a significant transaminitis.  CT abdomen and pelvis done suspicious for an acute cholecystitis.  Right upper quadrant ultrasound done with cholelithiasis without sonographic evidence of acute cholecystitis.  Acute hepatitis panel negative.  Patient placed empirically on IV Zosyn.  Continue hydration with IV fluids General surgery consulted who ordered MRCP and recommended GI evaluation.  GI and general surgery consulted.  Plan for lap chole on 06/05/2021.  06/06/2021: Seen prior to ERCP, abdominal pain improved this morning.  Denies having any nausea.  Assessment/Plan: Principal Problem:   Acute cholecystitis Active Problems:   Transaminitis   DM2 (diabetes mellitus, type 2) (HCC)   HTN (hypertension)  Acute cholecystitis with cholelithiasis and obstructive common bile duct. Post lap chole on 06/05/2021 by Dr. Georgette Dover MRCP from 06/04/2021 did not show evidence of choledocholithiasis however at [facility] on 06/05/2021, contrast stopped at distal CBD consistent with choledocholithiasis. GI consulted and following. ERCP completed on 06/06/2021. DM2 - Continue to hold off oral hypoglycemics. Sensitive SSI Q4H while NPO Resume oral intake when okay with general surgery. HTN - BP is not at goal, elevated. Suspect elevation in blood pressure contributed by pain. Continue home Lopressor. IV antihypertensives as needed with parameters.   DVT prophylaxis: SCDs Code Status: Full Family Communication: Wife at bedside Disposition Plan: Home after cleared by gen surg and GI Consults called: EDP spoke with Dr. Zenia Resides, I sent message to Dr. Bryan Lemma Admission status: Admit to inpatient   Code Status: Full  code  Family Communication: Updated his wife at bedside.  Disposition Plan: Home when cleared by general surgery and GI.   Consultants: General surgery GI.  Procedures: Lap chole on 06/05/2021.  Antimicrobials: IV Zosyn  DVT prophylaxis: SCDs  Status is: Inpatient    Dispo: The patient is from: Home.               Anticipated d/c is to: Home.               Patient currently not stable for discharge, postsurgery recovery.   Difficult to place patient, not applicable.         Objective: Vitals:   06/06/21 1609 06/06/21 1612 06/06/21 1617 06/06/21 1652  BP: (!) 148/92  (!) 157/92 (!) 152/90  Pulse: 85 85 87 91  Resp: 19 20 (!) 24 20  Temp:    98.6 F (37 C)  TempSrc:    Oral  SpO2: 90% 92% 92% 96%  Weight:      Height:        Intake/Output Summary (Last 24 hours) at 06/06/2021 1927 Last data filed at 06/06/2021 1530 Gross per 24 hour  Intake 600 ml  Output --  Net 600 ml   Filed Weights   06/04/21 0022 06/05/21 0930 06/06/21 1345  Weight: 111.1 kg 111.1 kg 111.1 kg    Exam:  General: 80 y.o. year-old male well developed well nourished in no acute stress.  He is alert oriented x3.   Cardiovascular: Regular rate and rhythm no rubs or gallops.   Respiratory: Clear to auscultation with no wheezes or rales.  Good inspiratory effort. Abdomen: Soft with bowel sounds present.  Nondistended. Musculoskeletal: No lower extremity edema bilaterally. Skin: No ulcerative lesions noted. Psychiatry: Mood is  appropriate for condition and setting.   Data Reviewed: CBC: Recent Labs  Lab 06/04/21 0032 06/05/21 0319 06/05/21 1839 06/06/21 0106  WBC 7.7 9.0 8.7 9.5  HGB 15.7 13.7 14.1 15.0  HCT 46.1 39.8 42.2 45.3  MCV 96.0 93.0 95.7 95.6  PLT 193 190 188 [id number]   Basic Metabolic Panel: Recent Labs  Lab 06/04/21 0032 06/05/21 0319 06/05/21 1839 06/06/21 0415  NA 132* 132* 136 134*  K 3.5 3.1* 4.0 4.2  CL 93* 96* 99 97*  CO2 21* '25 27 27  '$ GLUCOSE 214* 156*  181* 169*  BUN '19 16 16 15  '$ CREATININE 1.37* 1.29* 1.10 1.10  CALCIUM 9.3 8.7* 8.7* 9.0  MG  --  1.5* 2.1  --    GFR: Estimated Creatinine Clearance: 71.2 mL/min (by C-G formula based on SCr of 1.1 mg/dL). Liver Function Tests: Recent Labs  Lab 06/04/21 0032 06/05/21 0319 06/05/21 1839 06/06/21 0415  AST 800* 178* 137* 98*  ALT 467* 256* 218* 188*  ALKPHOS 334* 247* 235* 217*  BILITOT 4.0* 5.5* 4.5* 3.5*  PROT 7.2 6.4* 6.5 6.3*  ALBUMIN 3.2* 2.6* 2.5* 2.4*   Recent Labs  Lab 06/04/21 0032  LIPASE 27   No results for input(s): AMMONIA in the last 168 hours. Coagulation Profile: No results for input(s): INR, PROTIME in the last 168 hours. Cardiac Enzymes: No results for input(s): CKTOTAL, CKMB, CKMBINDEX, TROPONINI in the last 168 hours. BNP (last 3 results) No results for input(s): PROBNP in the last 8760 hours. HbA1C: Recent Labs    06/04/21 0513  HGBA1C 7.0*   CBG: Recent Labs  Lab 06/06/21 0006 06/06/21 0359 06/06/21 0814 06/06/21 1115 06/06/21 1657  GLUCAP 195* 157* 156* 149* 182*   Lipid Profile: No results for input(s): CHOL, HDL, LDLCALC, TRIG, CHOLHDL, LDLDIRECT in the last 72 hours. Thyroid Function Tests: No results for input(s): TSH, T4TOTAL, FREET4, T3FREE, THYROIDAB in the last 72 hours. Anemia Panel: No results for input(s): VITAMINB12, FOLATE, FERRITIN, TIBC, IRON, RETICCTPCT in the last 72 hours. Urine analysis:    Component Value Date/Time   COLORURINE AMBER (A) 06/04/2021 0026   APPEARANCEUR HAZY (A) 06/04/2021 0026   LABSPEC 1.026 06/04/2021 0026   PHURINE 5.0 06/04/2021 0026   GLUCOSEU >=500 (A) 06/04/2021 0026   HGBUR SMALL (A) 06/04/2021 0026   BILIRUBINUR NEGATIVE 06/04/2021 0026   KETONESUR 20 (A) 06/04/2021 0026   PROTEINUR 30 (A) 06/04/2021 0026   NITRITE NEGATIVE 06/04/2021 0026   LEUKOCYTESUR NEGATIVE 06/04/2021 0026   Sepsis Labs: '@LABRCNTIP'$ (procalcitonin:4,lacticidven:4)  ) Recent Results (from the past 240  hour(s))  Resp Panel by RT-PCR (Flu A&B, Covid) Nasopharyngeal Swab     Status: None   Collection Time: 06/04/21  4:37 AM   Specimen: Nasopharyngeal Swab; Nasopharyngeal(NP) swabs in vial transport medium  Result Value Ref Range Status   SARS Coronavirus 2 by RT PCR NEGATIVE NEGATIVE Final    Comment: (NOTE) SARS-CoV-2 target nucleic acids are NOT DETECTED.  The SARS-CoV-2 RNA is generally detectable in upper respiratory specimens during the acute phase of infection. The lowest concentration of SARS-CoV-2 viral copies this assay can detect is 138 copies/mL. A negative result does not preclude SARS-Cov-2 infection and should not be used as the sole basis for treatment or other patient management decisions. A negative result may occur with  improper specimen collection/handling, submission of specimen other than nasopharyngeal swab, presence of viral mutation(s) within the areas targeted by this assay, and inadequate number of viral copies(<138 copies/mL). A negative  result must be combined with clinical observations, patient history, and epidemiological information. The expected result is Negative.  Fact Sheet for Patients:  EntrepreneurPulse.com.au  Fact Sheet for Healthcare Providers:  IncredibleEmployment.be  This test is no t yet approved or cleared by the [place] FDA and  has been authorized for detection and/or diagnosis of SARS-CoV-2 by FDA under an Emergency Use Authorization (EUA). This EUA will remain  in effect (meaning this test can be used) for the duration of the COVID-19 declaration under Section 564(b)(1) of the Act, 21 U.S.C.section 360bbb-3(b)(1), unless the authorization is terminated  or revoked sooner.       Influenza A by PCR NEGATIVE NEGATIVE Final   Influenza B by PCR NEGATIVE NEGATIVE Final    Comment: (NOTE) The Xpert Xpress SARS-CoV-2/FLU/RSV plus assay is intended as an aid in the diagnosis of influenza from  Nasopharyngeal swab specimens and should not be used as a sole basis for treatment. Nasal washings and aspirates are unacceptable for Xpert Xpress SARS-CoV-2/FLU/RSV testing.  Fact Sheet for Patients: EntrepreneurPulse.com.au  Fact Sheet for Healthcare Providers: IncredibleEmployment.be  This test is not yet approved or cleared by the [place] FDA and has been authorized for detection and/or diagnosis of SARS-CoV-2 by FDA under an Emergency Use Authorization (EUA). This EUA will remain in effect (meaning this test can be used) for the duration of the COVID-19 declaration under Section 564(b)(1) of the Act, 21 U.S.C. section 360bbb-3(b)(1), unless the authorization is terminated or revoked.  Performed at [facility], [place] [address] [place],  [address]       Studies: DG ERCP  Result Date: 06/06/2021 CLINICAL DATA:  Acute cholecystitis EXAM: ERCP TECHNIQUE: Multiple spot images obtained with the fluoroscopic device and submitted for interpretation post-procedure. COMPARISON:  Internal cholangiogram from the previous day FINDINGS: A series of fluoroscopic spot images document endoscopic cannulation and opacification of the CBD and biliary tree with subsequent passage of balloon tipped catheter through the CBD. There is incomplete opacification of the peripheral biliary tree, which appears mildly distended centrally. No evidence of extravasation. Cholecystectomy clips noted. IMPRESSION: Endoscopic CBD cannulation and intervention as above. These images were submitted for radiologic interpretation only. Please see the procedural report for the amount of contrast and the fluoroscopy time utilized. Electronically Signed   By: Lucrezia Europe M.D.   On: 06/06/2021 15:44    Scheduled Meds:  acetaminophen  650 mg Oral Q6H   docusate sodium  100 mg Oral BID   insulin aspart  0-9 Units Subcutaneous Q4H   metoprolol tartrate  12.5 mg Oral BID     Continuous Infusions:  lactated ringers 50 mL/hr at 06/06/21 1430   piperacillin-tazobactam (ZOSYN)  IV 3.375 g (06/06/21 1310)     LOS: 1 day     Kayleen Memos, MD [facility] Pager [phone]  If 7PM-7AM, please contact night-coverage [email or web address] Password [facility] 06/06/2021, 7:27 PM

## 2021-06-06 NOTE — Anesthesia Preprocedure Evaluation (Addendum)
Anesthesia Evaluation  Patient identified by MRN, date of birth, ID band Patient awake    Reviewed: Allergy & Precautions, NPO status , Patient's Chart, lab work & pertinent test results  History of Anesthesia Complications Negative for: history of anesthetic complications  Airway Mallampati: II  TM Distance: >3 FB Neck ROM: Full    Dental no notable dental hx.    Pulmonary neg pulmonary ROS, former smoker,   Hx TB s/p tx    Pulmonary exam normal breath sounds clear to auscultation       Cardiovascular hypertension, Pt. on home beta blockers and Pt. on medications Normal cardiovascular exam Rhythm:Regular Rate:Normal     Neuro/Psych negative neurological ROS  negative psych ROS   GI/Hepatic negative GI ROS, Acute cholecystitis   Endo/Other  diabetes, Type 2, Oral Hypoglycemic Agents Obesity   Renal/GU negative Renal ROS  negative genitourinary   Musculoskeletal negative musculoskeletal ROS (+)   Abdominal   Peds negative pediatric ROS (+)  Hematology negative hematology ROS (+)   Anesthesia Other Findings   Reproductive/Obstetrics negative OB ROS                            Anesthesia Physical Anesthesia Plan  ASA: 2  Anesthesia Plan: General   Post-op Pain Management:    Induction: Intravenous  PONV Risk Score and Plan: 2 and Treatment may vary due to age or medical condition, Ondansetron and Dexamethasone  Airway Management Planned: Oral ETT  Additional Equipment: None  Intra-op Plan:   Post-operative Plan: Extubation in OR  Informed Consent: I have reviewed the patients History and Physical, chart, labs and discussed the procedure including the risks, benefits and alternatives for the proposed anesthesia with the patient or authorized representative who has indicated his/her understanding and acceptance.     Dental advisory given  Plan Discussed with: CRNA and  Surgeon  Anesthesia Plan Comments:        Anesthesia Quick Evaluation

## 2021-06-06 NOTE — Interval H&P Note (Signed)
History and Physical Interval Note:  06/06/2021 1:41 PM  Chad Oneill  has presented today for surgery, with the diagnosis of CBD stone detected on intraoperative cholangiogram 9/7 though MRCP of 9/6 did not show CBD stone..  The various methods of treatment have been discussed with the patient and family. After consideration of risks, benefits and other options for treatment, the patient has consented to  Procedure(s): ENDOSCOPIC RETROGRADE CHOLANGIOPANCREATOGRAPHY (ERCP) WITH PROPOFOL (N/A) as a surgical intervention.  The patient's history has been reviewed, patient examined, no change in status, stable for surgery.  I have reviewed the patient's chart and labs.  Questions were answered to the patient's satisfaction.     Jackquline Denmark

## 2021-06-06 NOTE — Progress Notes (Signed)
Progress Note  1 Day Post-Op  Subjective: Events of last night noted. He states no further episodes of sweating. His pain is currently well controlled and he does not like taking opioid pain medication due to nausea. Prior to NPO for ERCP he was tolerating diet without nausea or emesis.   Wife is bedside  Objective: Vital signs in last 24 hours: Temp:  [97.2 F (36.2 C)-98.7 F (37.1 C)] 97.8 F (36.6 C) (09/08 0356) Pulse Rate:  [83-106] 90 (09/08 0356) Resp:  [18-31] 18 (09/08 0356) BP: (135-160)/(75-106) 139/96 (09/08 0356) SpO2:  [89 %-97 %] 97 % (09/08 0356) Weight:  [111.1 kg] 111.1 kg (09/07 0930)    Intake/Output from previous day: 09/07 0701 - 09/08 0700 In: 826 [I.V.:700; IV Piggyback:126] Out: 10 [Blood:10] Intake/Output this shift: No intake/output data recorded.  PE: General: pleasant, WD, male who is laying in bed in NAD HEENT: head is normocephalic, atraumatic. Mouth is pink and moist Heart: Palpable radial and pedal pulses bilaterally Lungs: Respiratory effort nonlabored Abd: soft, ND, +BS, mild expected postoperative tenderness without rebound or guarding. Incisions with bandages c/d/i MSK: all 4 extremities are symmetrical with no cyanosis, clubbing, or edema. Skin: warm and dry with no masses, lesions, or rashes Psych: A&Ox3 with an appropriate affect.    Lab Results:  Recent Labs    06/05/21 1839 06/06/21 0106  WBC 8.7 9.5  HGB 14.1 15.0  HCT 42.2 45.3  PLT 188 161   BMET Recent Labs    06/05/21 1839 06/06/21 0415  NA 136 134*  K 4.0 4.2  CL 99 97*  CO2 27 27  GLUCOSE 181* 169*  BUN 16 15  CREATININE 1.10 1.10  CALCIUM 8.7* 9.0   PT/INR No results for input(s): LABPROT, INR in the last 72 hours. CMP     Component Value Date/Time   NA 134 (L) 06/06/2021 0415   K 4.2 06/06/2021 0415   CL 97 (L) 06/06/2021 0415   CO2 27 06/06/2021 0415   GLUCOSE 169 (H) 06/06/2021 0415   BUN 15 06/06/2021 0415   CREATININE 1.10 06/06/2021  0415   CALCIUM 9.0 06/06/2021 0415   PROT 6.3 (L) 06/06/2021 0415   ALBUMIN 2.4 (L) 06/06/2021 0415   AST 98 (H) 06/06/2021 0415   ALT 188 (H) 06/06/2021 0415   ALKPHOS 217 (H) 06/06/2021 0415   BILITOT 3.5 (H) 06/06/2021 0415   GFRNONAA >60 06/06/2021 0415   GFRAA >60 08/31/2017 0758   Lipase     Component Value Date/Time   LIPASE 27 06/04/2021 0032       Studies/Results: DG Cholangiogram Operative  Result Date: 06/05/2021 CLINICAL DATA:  Lap chole with IOC. EXAM: INTRAOPERATIVE CHOLANGIOGRAM COMPARISON:  CT abdomen, MRI abdomen and ultrasound abdomen, 06/04/2021. FLUOROSCOPY TIME:  Fluoroscopy Time:  19 seconds Radiation Exposure Index (if provided by the fluoroscopic device): 12 mGy Number of Acquired Spot Images: 45, submitted FINDINGS: Multiple, oblique planar images of the RIGHT upper quadrant demonstrating instrumentation, cystic duct cannulation and antegrade cholangiogram. No intra- or extrahepatic biliary dilatation. No discrete filling defect identified. Cholecystectomy clips. IMPRESSION: Intra procedural fluoroscopic imaging for IOC. No discrete biliary filling defects identified. Please see performing service dictation for complete description of intra procedural findings. Electronically Signed   By: Michaelle Birks M.D.   On: 06/05/2021 11:24    Anti-infectives: Anti-infectives (From admission, onward)    Start     Dose/Rate Route Frequency Ordered Stop   06/04/21 1400  piperacillin-tazobactam (ZOSYN) IVPB 3.375 g  3.375 g 12.5 mL/hr over 240 Minutes Intravenous Every 8 hours 06/04/21 0519     06/04/21 0445  piperacillin-tazobactam (ZOSYN) IVPB 3.375 g        3.375 g 100 mL/hr over 30 Minutes Intravenous  Once 06/04/21 0435 06/04/21 0538        Assessment/Plan Acute cholecystitis - POD1 s/p lap chole with IOC - Dr Georgette Dover 9/7. Positive IOC - GI planning on ERCP today - doing well from surgical standpoint and can resume regular diet after ERCP - encouraged  ambulation - scheduled tylenol for pain control  FEN: NPO for ERCP ID: zosyn 9/5> VTE: SCDs. Okay for chemical prophylaxis from surgical standpoint  T2DM HTN    LOS: 1 day    Winferd Humphrey, [facility] 06/06/2021, 8:53 AM Please see Amion for pager number during day hours 7:00am-4:30pm

## 2021-06-07 ENCOUNTER — Encounter (HOSPITAL_COMMUNITY): Payer: Self-pay | Admitting: Gastroenterology

## 2021-06-07 LAB — COMPREHENSIVE METABOLIC PANEL
ALT: 132 U/L — ABNORMAL HIGH (ref 0–44)
AST: 59 U/L — ABNORMAL HIGH (ref 15–41)
Albumin: 2.3 g/dL — ABNORMAL LOW (ref 3.5–5.0)
Alkaline Phosphatase: 214 U/L — ABNORMAL HIGH (ref 38–126)
Anion gap: 13 (ref 5–15)
BUN: 18 mg/dL (ref 8–23)
CO2: 26 mmol/L (ref 22–32)
Calcium: 8.6 mg/dL — ABNORMAL LOW (ref 8.9–10.3)
Chloride: 95 mmol/L — ABNORMAL LOW (ref 98–111)
Creatinine, Ser: 1.01 mg/dL (ref 0.61–1.24)
GFR, Estimated: >60 mL/min (ref >60)
Glucose, Bld: 197 mg/dL — ABNORMAL HIGH (ref 70–99)
Potassium: 4.1 mmol/L (ref 3.5–5.1)
Sodium: 134 mmol/L — ABNORMAL LOW (ref 135–145)
Total Bilirubin: 2.5 mg/dL — ABNORMAL HIGH (ref 0.3–1.2)
Total Protein: 6 g/dL — ABNORMAL LOW (ref 6.5–8.1)

## 2021-06-07 LAB — CBC
HCT: 41.1 % (ref 39.0–52.0)
Hemoglobin: 13.7 g/dL (ref 13.0–17.0)
MCH: 31.7 pg (ref 26.0–34.0)
MCHC: 33.3 g/dL (ref 30.0–36.0)
MCV: 95.1 fL (ref 80.0–100.0)
Platelets: 216 10*3/uL (ref 150–400)
RBC: 4.32 MIL/uL (ref 4.22–5.81)
RDW: 14.2 % (ref 11.5–15.5)
WBC: 8.5 10*3/uL (ref 4.0–10.5)
nRBC: 0 % (ref 0.0–0.2)

## 2021-06-07 LAB — GLUCOSE, CAPILLARY
Glucose-Capillary: 179 mg/dL — ABNORMAL HIGH (ref 70–99)
Glucose-Capillary: 225 mg/dL — ABNORMAL HIGH (ref 70–99)
Glucose-Capillary: 229 mg/dL — ABNORMAL HIGH (ref 70–99)

## 2021-06-07 LAB — SURGICAL PATHOLOGY

## 2021-06-07 MED ORDER — ENOXAPARIN SODIUM 60 MG/0.6ML IJ SOSY
55.0000 mg | PREFILLED_SYRINGE | INTRAMUSCULAR | Status: DC
  Administered 2021-06-07: 55 mg via SUBCUTANEOUS
  Filled 2021-06-07: qty 0.6

## 2021-06-07 NOTE — Discharge Summary (Signed)
Discharge Summary  Chad Oneill [id number] DOB: [birth year]  PCP: Prince Solian, MD  Admit date: 06/04/2021 Discharge date: 06/07/2021  Time spent: 35 minutes  Recommendations for Outpatient Follow-up:  Follow-up with GI Follow-up with general surgery Take your medications as prescribed  Discharge Diagnoses:  Active Hospital Problems   Diagnosis Date Noted   Acute cholecystitis 06/04/2021   Transaminitis 06/04/2021   DM2 (diabetes mellitus, type 2) ([facility]) 06/04/2021   HTN (hypertension) 06/04/2021    Resolved Hospital Problems  No resolved problems to display.    Discharge Condition: Stable  Diet recommendation: Low-fat diet.  Vitals:   06/07/21 0407 06/07/21 1245  BP: (!) 150/99 137/82  Pulse: 72 81  Resp: 20 16  Temp: 97.7 F (36.5 C) 98.6 F (37 C)  SpO2: 93% 95%    History of present illness:   Patient is a pleasant 80 year old gentleman history of type 2 diabetes, hypertension, hyperlipidemia presented to the ED with epigastric abdominal pain.  Lab work obtained with a significant transaminitis.  CT abdomen and pelvis done suspicious for an acute cholecystitis.  Right upper quadrant ultrasound done with cholelithiasis without sonographic evidence of acute cholecystitis.  Acute hepatitis panel negative.  Patient placed empirically on IV Zosyn.  General surgery consulted, MRCP obtained, and GI consulted.  Status post lap chole with IOC on 06/05/2021 by Dr. Georgette Dover.  Positive IOC.  Successful ERCP on 06/06/2021.   06/07/2021: Seen and examined at his bedside.  His wife was present in the room.  He has no new complaints.  He is eager to go home.  He is tolerating a low-fat diet, ambulating without dizziness.  Hospital Course:  Principal Problem:   Acute cholecystitis Active Problems:   Transaminitis   DM2 (diabetes mellitus, type 2) ([facility])   HTN (hypertension)  Acute cholecystitis with cholelithiasis and obstructive common bile duct. Post lap chole with IOC on  06/05/2021 by Dr. Georgette Dover.  Positive IOC. MRCP from 06/04/2021 did not show evidence of choledocholithiasis however at [facility] on 06/05/2021, contrast stopped at distal CBD consistent with choledocholithiasis. GI consulted. ERCP completed on 06/06/2021 successfully. Follow-up with GI and general surgery. Continue low-fat diet  Acute transaminitis in the setting of above LFTs are downtrending.  DM2 with hyperglycemia- Restart home regimen. Follow-up with your PCP. HTN - Restart home regimen. Follow-up with your PCP    Code Status: Full    Code Status: Full code   Family Communication: Updated his wife at bedside.      Consultants: General surgery GI.   Procedures: Lap chole on 06/05/2021. ERCP on 06/06/2021.   Antimicrobials: IV Zosyn, DC'd on 06/07/2021.    Discharge Exam: BP 137/82 (BP Location: Left Arm)   Pulse 81   Temp 98.6 F (37 C) (Oral)   Resp 16   Ht '6\' 1"'$  (1.854 m)   Wt 111.1 kg   SpO2 95%   BMI 32.31 kg/m  General: 80 y.o. year-old male well developed well nourished in no acute distress.  Alert and oriented x3. Cardiovascular: Regular rate and rhythm with no rubs or gallops.  No thyromegaly or JVD noted.   Respiratory: Clear to auscultation with no wheezes or rales. Good inspiratory effort. Abdomen: Soft nontender nondistended with normal bowel sounds x4 quadrants. Musculoskeletal: No lower extremity edema. 2/4 pulses in all 4 extremities. Skin: No ulcerative lesions noted or rashes, Psychiatry: Mood is appropriate for condition and setting  Discharge Instructions You were cared for by a hospitalist during your hospital stay. If you have  any questions about your discharge medications or the care you received while you were in the hospital after you are discharged, you can call the unit and asked to speak with the hospitalist on call if the hospitalist that took care of you is not available. Once you are discharged, your primary care physician will handle any further  medical issues. Please note that NO REFILLS for any discharge medications will be authorized once you are discharged, as it is imperative that you return to your primary care physician (or establish a relationship with a primary care physician if you do not have one) for your aftercare needs so that they can reassess your need for medications and monitor your lab values.   Allergies as of 06/07/2021       Reactions   Other    Insect stings- hives, difficulty breathing   Mercury Rash   REACTION: rash        Medication List     TAKE these medications    acetaminophen 500 MG tablet Commonly known as: TYLENOL Take 1,000 mg by mouth every 6 (six) hours as needed for moderate pain.   aspirin EC 81 MG tablet Take 81 mg by mouth at bedtime.   atorvastatin 10 MG tablet Commonly known as: LIPITOR Take 10 mg by mouth at bedtime.   diltiazem 300 MG 24 hr capsule Commonly known as: TIAZAC Take 300 mg by mouth daily.   doxazosin 4 MG tablet Commonly known as: CARDURA Take 2 mg by mouth at bedtime.   Dulaglutide 0.75 MG/0.5ML Sopn Inject 0.5 mLs into the skin every Sunday.   Jardiance 10 MG Tabs tablet Generic drug: empagliflozin Take 10 mg by mouth daily.   metFORMIN 500 MG 24 hr tablet Commonly known as: GLUCOPHAGE-XR Take 1,000 mg by mouth 2 (two) times daily.   metoprolol tartrate 25 MG tablet Commonly known as: LOPRESSOR Take 12.5 mg by mouth 2 (two) times daily.       Allergies  Allergen Reactions   Other     Insect stings- hives, difficulty breathing   Mercury Rash    REACTION: rash    Follow-up Information     [facility] Follow up on 06/18/2021.   Specialty: General Surgery Why: Please follow up on 06/18/21 at 10:15 am. Please arrive 30 minutes early to complete paperwork and bring photo ID and insurance card Contact information: [place] [address] [phone]         Prince Solian, MD. Call today.    Specialty: Internal Medicine Why: Please call for a posthospital follow-up appointment. Contact information: [address] [place] [address] [phone]         Jackquline Denmark, MD. Call today.   Specialties: Gastroenterology, Internal Medicine Why: Please call for a posthospital follow-up appointment. Contact information: [place] [address] [phone]                  The results of significant diagnostics from this hospitalization (including imaging, microbiology, ancillary and laboratory) are listed below for reference.    Significant Diagnostic Studies: DG Cholangiogram Operative  Result Date: 06/05/2021 CLINICAL DATA:  Lap chole with IOC. EXAM: INTRAOPERATIVE CHOLANGIOGRAM COMPARISON:  CT abdomen, MRI abdomen and ultrasound abdomen, 06/04/2021. FLUOROSCOPY TIME:  Fluoroscopy Time:  19 seconds Radiation Exposure Index (if provided by the fluoroscopic device): 12 mGy Number of Acquired Spot Images: 45, submitted FINDINGS: Multiple, oblique planar images of the RIGHT upper  quadrant demonstrating instrumentation, cystic duct cannulation and antegrade cholangiogram. No intra- or extrahepatic biliary dilatation. No discrete filling defect identified. Cholecystectomy clips. IMPRESSION: Intra procedural fluoroscopic imaging for IOC. No discrete biliary filling defects identified. Please see performing service dictation for complete description of intra procedural findings. Electronically Signed   By: Michaelle Birks M.D.   On: 06/05/2021 11:24   CT ABDOMEN PELVIS W CONTRAST  Result Date: 06/04/2021 CLINICAL DATA:  80 year old male with epigastric pain. Left lower quadrant pain. Abnormal LFTs. EXAM: CT ABDOMEN AND PELVIS WITH CONTRAST TECHNIQUE: Multidetector CT imaging of the abdomen and pelvis was performed using the standard protocol following bolus administration of intravenous contrast. CONTRAST:  182m OMNIPAQUE IOHEXOL 350 MG/ML  SOLN COMPARISON:  Right upper quadrant ultrasound 0345 hours today. FINDINGS: Lower chest: Patchy dependent and peribronchial opacity in both lungs more resembles atelectasis than infection. There is no pleural or pericardial effusion. Hepatobiliary: Cholelithiasis is evident by CT as demonstrated earlier today. But despite the fairly normal gallbladder wall thickness by earlier ultrasound there is pericholecystic inflammation demonstrated on series 3, image 33, coronal image 31, and the gallbladder wall appears indistinct. There is also inflammatory stranding in the mesentery of the adjacent transverse colon. There is subtle hyperenhancement of the liver parenchyma adjacent to the gallbladder fossa. No convincing bile duct enlargement. Elsewhere liver enhancement is normal. No free fluid. Pancreas: Negative. Spleen: Negative. Adrenals/Urinary Tract: Normal adrenal glands. Nonobstructed kidneys with symmetric renal enhancement and contrast excretion. Ureters are decompressed and normal. Negative bladder. Stomach/Bowel: Diverticulosis throughout a redundant sigmoid colon. Diverticulosis throughout the descending colon. Intermittent diverticula in the transverse colon. The transverse colon appears mildly inflamed adjacent to the gallbladder (coronal image 31). Diverticulosis also in the ascending colon. Cecum is distended with gas and stool. No other large bowel inflammation is identified. Normal retrocecal appendix on series 3, image 60. The cecum is on a lax mesentery located anteriorly. The terminal ileum is decompressed and negative. No dilated small bowel. Stomach and duodenum remain within normal limits. No free air or free fluid. Vascular/Lymphatic: Aortoiliac calcified atherosclerosis. Major arterial structures are patent. Portal venous system is patent. No lymphadenopathy. Reproductive: Negative. Other: No pelvic free fluid.  Numerous pelvic phleboliths. Musculoskeletal: Flowing endplate osteophytes resulting  in interbody ankylosis in the lower thoracic spine to L1. Lumbar facet degeneration. No acute osseous abnormality identified. IMPRESSION: 1. Cholelithiasis with Pericholecystic Inflammation highly suspicious for Acute Cholecystitis despite the earlier Ultrasound. Mild secondary inflammation of the adjacent hepatic flexure. No free fluid or other complicating features. 2. Extensive diverticulosis of the large bowel, but no other large bowel inflammation identified. Normal appendix. 3. Patchy dependent and peribronchial opacity in both lungs more resembles atelectasis than infection. No pleural effusion. 4. Aortic Atherosclerosis (ICD10-I70.0). Electronically Signed   By: HGenevie AnnM.D.   On: 06/04/2021 04:27   MR 3D Recon At Scanner  Result Date: 06/04/2021 CLINICAL DATA:  Suspected choledocholithiasis EXAM: MRI ABDOMEN WITHOUT AND WITH CONTRAST (INCLUDING MRCP) TECHNIQUE: Multiplanar multisequence MR imaging of the abdomen was performed both before and after the administration of intravenous contrast. Heavily T2-weighted images of the biliary and pancreatic ducts were obtained, and three-dimensional MRCP images were rendered by post processing. CONTRAST:  171mGADAVIST GADOBUTROL 1 MMOL/ML IV SOLN COMPARISON:  Same day CT of the abdomen and pelvis FINDINGS: Lower chest: No acute findings. Hepatobiliary: No suspicious liver lesions. Gallbladder is dilated and thick-walled with surrounding inflammatory change. Small stones and sludge are seen. Mildly dilated intrahepatic and common bile ducts. Common  bile duct measures up to 7 mm. No choledocholithiasis. Pancreas: No mass, inflammatory changes, or other parenchymal abnormality identified. Spleen:  Within normal limits in size and appearance. Adrenals/Urinary Tract: No masses identified. No evidence of hydronephrosis. Stomach/Bowel: Visualized portions within the abdomen are unremarkable. Vascular/Lymphatic: No pathologically enlarged lymph nodes identified. No  abdominal aortic aneurysm demonstrated. Other:  None. Musculoskeletal: No suspicious bone lesions identified. IMPRESSION: Findings compatible with acute cholecystitis again seen. No evidence of choledocholithiasis. Electronically Signed   By: Yetta Glassman M.D.   On: 06/04/2021 09:32   DG ERCP  Result Date: 06/06/2021 CLINICAL DATA:  Acute cholecystitis EXAM: ERCP TECHNIQUE: Multiple spot images obtained with the fluoroscopic device and submitted for interpretation post-procedure. COMPARISON:  Internal cholangiogram from the previous day FINDINGS: A series of fluoroscopic spot images document endoscopic cannulation and opacification of the CBD and biliary tree with subsequent passage of balloon tipped catheter through the CBD. There is incomplete opacification of the peripheral biliary tree, which appears mildly distended centrally. No evidence of extravasation. Cholecystectomy clips noted. IMPRESSION: Endoscopic CBD cannulation and intervention as above. These images were submitted for radiologic interpretation only. Please see the procedural report for the amount of contrast and the fluoroscopy time utilized. Electronically Signed   By: Lucrezia Europe M.D.   On: 06/06/2021 15:44   MR ABDOMEN MRCP W WO CONTAST  Result Date: 06/04/2021 CLINICAL DATA:  Suspected choledocholithiasis EXAM: MRI ABDOMEN WITHOUT AND WITH CONTRAST (INCLUDING MRCP) TECHNIQUE: Multiplanar multisequence MR imaging of the abdomen was performed both before and after the administration of intravenous contrast. Heavily T2-weighted images of the biliary and pancreatic ducts were obtained, and three-dimensional MRCP images were rendered by post processing. CONTRAST:  4m GADAVIST GADOBUTROL 1 MMOL/ML IV SOLN COMPARISON:  Same day CT of the abdomen and pelvis FINDINGS: Lower chest: No acute findings. Hepatobiliary: No suspicious liver lesions. Gallbladder is dilated and thick-walled with surrounding inflammatory change. Small stones and sludge  are seen. Mildly dilated intrahepatic and common bile ducts. Common bile duct measures up to 7 mm. No choledocholithiasis. Pancreas: No mass, inflammatory changes, or other parenchymal abnormality identified. Spleen:  Within normal limits in size and appearance. Adrenals/Urinary Tract: No masses identified. No evidence of hydronephrosis. Stomach/Bowel: Visualized portions within the abdomen are unremarkable. Vascular/Lymphatic: No pathologically enlarged lymph nodes identified. No abdominal aortic aneurysm demonstrated. Other:  None. Musculoskeletal: No suspicious bone lesions identified. IMPRESSION: Findings compatible with acute cholecystitis again seen. No evidence of choledocholithiasis. Electronically Signed   By: LYetta GlassmanM.D.   On: 06/04/2021 09:32   UKoreaAbdomen Limited RUQ (LIVER/GB)  Result Date: 06/04/2021 CLINICAL DATA:  Right upper quadrant abdominal pain EXAM: ULTRASOUND ABDOMEN LIMITED RIGHT UPPER QUADRANT COMPARISON:  None. FINDINGS: Gallbladder: The gallbladder is mildly distended and contains layering sludge and small shadowing stones which appear mobile on decubitus positioning. There is no gallbladder wall thickening or pericholecystic fluid identified. The sonographic MPercell Millersign is reportedly negative. Common bile duct: Diameter: 5 mm in proximal diameter. Liver: No focal lesion identified. Within normal limits in parenchymal echogenicity. Portal vein is patent on color Doppler imaging with normal direction of blood flow towards the liver. Other: None. IMPRESSION: Cholelithiasis without sonographic evidence of acute cholecystitis. Electronically Signed   By: AFidela SalisburyM.D.   On: 06/04/2021 04:02    Microbiology: Recent Results (from the past 240 hour(s))  Resp Panel by RT-PCR (Flu A&B, Covid) Nasopharyngeal Swab     Status: None   Collection Time: 06/04/21  4:37 AM  Specimen: Nasopharyngeal Swab; Nasopharyngeal(NP) swabs in vial transport medium  Result Value Ref Range  Status   SARS Coronavirus 2 by RT PCR NEGATIVE NEGATIVE Final    Comment: (NOTE) SARS-CoV-2 target nucleic acids are NOT DETECTED.  The SARS-CoV-2 RNA is generally detectable in upper respiratory specimens during the acute phase of infection. The lowest concentration of SARS-CoV-2 viral copies this assay can detect is 138 copies/mL. A negative result does not preclude SARS-Cov-2 infection and should not be used as the sole basis for treatment or other patient management decisions. A negative result may occur with  improper specimen collection/handling, submission of specimen other than nasopharyngeal swab, presence of viral mutation(s) within the areas targeted by this assay, and inadequate number of viral copies(<138 copies/mL). A negative result must be combined with clinical observations, patient history, and epidemiological information. The expected result is Negative.  Fact Sheet for Patients:  EntrepreneurPulse.com.au  Fact Sheet for Healthcare Providers:  IncredibleEmployment.be  This test is no t yet approved or cleared by the [place] FDA and  has been authorized for detection and/or diagnosis of SARS-CoV-2 by FDA under an Emergency Use Authorization (EUA). This EUA will remain  in effect (meaning this test can be used) for the duration of the COVID-19 declaration under Section 564(b)(1) of the Act, 21 U.S.C.section 360bbb-3(b)(1), unless the authorization is terminated  or revoked sooner.       Influenza A by PCR NEGATIVE NEGATIVE Final   Influenza B by PCR NEGATIVE NEGATIVE Final    Comment: (NOTE) The Xpert Xpress SARS-CoV-2/FLU/RSV plus assay is intended as an aid in the diagnosis of influenza from Nasopharyngeal swab specimens and should not be used as a sole basis for treatment. Nasal washings and aspirates are unacceptable for Xpert Xpress SARS-CoV-2/FLU/RSV testing.  Fact Sheet for  Patients: EntrepreneurPulse.com.au  Fact Sheet for Healthcare Providers: IncredibleEmployment.be  This test is not yet approved or cleared by the [place] FDA and has been authorized for detection and/or diagnosis of SARS-CoV-2 by FDA under an Emergency Use Authorization (EUA). This EUA will remain in effect (meaning this test can be used) for the duration of the COVID-19 declaration under Section 564(b)(1) of the Act, 21 U.S.C. section 360bbb-3(b)(1), unless the authorization is terminated or revoked.  Performed at [facility], [place] [address], [place], [place] [address]      Labs: Basic Metabolic Panel: Recent Labs  Lab 06/04/21 0032 06/05/21 0319 06/05/21 1839 06/06/21 0415 06/07/21 0025  NA 132* 132* 136 134* 134*  K 3.5 3.1* 4.0 4.2 4.1  CL 93* 96* 99 97* 95*  CO2 21* '25 27 27 26  '$ GLUCOSE 214* 156* 181* 169* 197*  BUN '19 16 16 15 18  '$ CREATININE 1.37* 1.29* 1.10 1.10 1.01  CALCIUM 9.3 8.7* 8.7* 9.0 8.6*  MG  --  1.5* 2.1  --   --    Liver Function Tests: Recent Labs  Lab 06/04/21 0032 06/05/21 0319 06/05/21 1839 06/06/21 0415 06/07/21 0025  AST 800* 178* 137* 98* 59*  ALT 467* 256* 218* 188* 132*  ALKPHOS 334* 247* 235* 217* 214*  BILITOT 4.0* 5.5* 4.5* 3.5* 2.5*  PROT 7.2 6.4* 6.5 6.3* 6.0*  ALBUMIN 3.2* 2.6* 2.5* 2.4* 2.3*   Recent Labs  Lab 06/04/21 0032  LIPASE 27   No results for input(s): AMMONIA in the last 168 hours. CBC: Recent Labs  Lab 06/04/21 0032 06/05/21 0319 06/05/21 1839 06/06/21 0106 06/07/21 0025  WBC 7.7 9.0 8.7 9.5 8.5  HGB  15.7 13.7 14.1 15.0 13.7  HCT 46.1 39.8 42.2 45.3 41.1  MCV 96.0 93.0 95.7 95.6 95.1  PLT 193 190 188 161 216   Cardiac Enzymes: No results for input(s): CKTOTAL, CKMB, CKMBINDEX, TROPONINI in the last 168 hours. BNP: BNP (last 3 results) No results for input(s): BNP in the last 8760 hours.  ProBNP (last 3 results) No results for input(s): PROBNP  in the last 8760 hours.  CBG: Recent Labs  Lab 06/06/21 2004 06/06/21 2343 06/07/21 0406 06/07/21 0804 06/07/21 1157  GLUCAP 220* 198* 179* 225* 229*       Signed:  Kayleen Memos, MD [facility] 06/07/2021, 6:32 PM

## 2021-06-07 NOTE — Progress Notes (Signed)
OT Cancellation Note  Patient Details Name: Chad Oneill MRN: [id number] DOB: 09/03/1941   Cancelled Treatment:     Pt. In process of dc.   Casmira Cramer 06/07/2021, 1:52 PM

## 2021-06-07 NOTE — Discharge Instructions (Signed)
[facility] ______[facility] LAPAROSCOPIC SURGERY: POST OP INSTRUCTIONS Always review your discharge instruction sheet given to you by the facility where your surgery was performed. IF YOU HAVE DISABILITY OR FAMILY LEAVE FORMS, YOU MUST BRING THEM TO THE OFFICE FOR PROCESSING.   DO NOT GIVE THEM TO YOUR DOCTOR.  A prescription for pain medication may be given to you upon discharge.  Take your pain medication as prescribed, if needed.  If narcotic pain medicine is not needed, then you may take acetaminophen (Tylenol) or ibuprofen (Advil) as needed. Take your usually prescribed medications unless otherwise directed. If you need a refill on your pain medication, please contact your pharmacy.  They will contact our office to request authorization. Prescriptions will not be filled after 5pm or on week-ends. You should follow a light diet the first few days after arrival home, such as soup and crackers, etc.  Be sure to include lots of fluids daily. Most patients will experience some swelling and bruising in the area of the incisions.  Ice packs will help.  Swelling and bruising can take several days to resolve.  It is common to experience some constipation if taking pain medication after surgery.  Increasing fluid intake and taking a stool softener (such as Colace) will usually help or prevent this problem from occurring.  A mild laxative (Milk of Magnesia or Miralax) should be taken according to package instructions if there are no bowel movements after 48 hours. Unless discharge instructions indicate otherwise, you may remove your bandages 24-48 hours after surgery, and you may shower at that time.  You may have steri-strips (small skin tapes) in place directly over the incision.  These strips should be left on the skin for 7-10 days.  If your surgeon used skin glue on the incision, you may shower in 24 hours.  The glue will flake off over the next 2-3 weeks.  Any sutures or staples will be  removed at the office during your follow-up visit. ACTIVITIES:  You may resume regular (light) daily activities beginning the next day--such as daily self-care, walking, climbing stairs--gradually increasing activities as tolerated.  You may have sexual intercourse when it is comfortable.  Refrain from any heavy lifting or straining until approved by your doctor. You may drive when you are no longer taking prescription pain medication, you can comfortably wear a seatbelt, and you can safely maneuver your car and apply brakes. RETURN TO WORK:  __________________________________________________________ You should see your doctor in the office for a follow-up appointment approximately 2-3 weeks after your surgery.  Make sure that you call for this appointment within a day or two after you arrive home to insure a convenient appointment time. OTHER INSTRUCTIONS: __________________________________________________________________________________________________________________________ __________________________________________________________________________________________________________________________ WHEN TO CALL YOUR DOCTOR: Fever over 101.0 Inability to urinate Continued bleeding from incision. Increased pain, redness, or drainage from the incision. Increasing abdominal pain  The clinic staff is available to answer your questions during regular business hours.  Please don't hesitate to call and ask to speak to one of the nurses for clinical concerns.  If you have a medical emergency, go to the nearest emergency room or call 911.  A surgeon from [facility] is always on call at the hospital. [address], [address], [place], [place]  [address] ? [address] [address], [place], [place]   [address] [phone] ? [phone] ? FAX [phone] Web site: [email or web address]  

## 2021-06-07 NOTE — Progress Notes (Signed)
Progress Note  1 Day Post-Op  Subjective: ERCP yesterday. Still with expected post operative abdominal pain but not worsening. He had a regular breakfast without nausea/emesis. Passing flatus. No BM  Wife is bedside  Objective: Vital signs in last 24 hours: Temp:  [97.7 F (36.5 C)-98.6 F (37 C)] 97.7 F (36.5 C) (09/09 0407) Pulse Rate:  [72-94] 72 (09/09 0407) Resp:  [18-24] 20 (09/09 0407) BP: (143-172)/(85-108) 150/99 (09/09 0407) SpO2:  [90 %-97 %] 93 % (09/09 0407) Weight:  [111.1 kg] 111.1 kg (09/08 1345)    Intake/Output from previous day: 09/08 0701 - 09/09 0700 In: 840 [P.O.:240; I.V.:600] Out: 180 [Urine:180] Intake/Output this shift: No intake/output data recorded.  PE: General: pleasant, WD, male who is laying in bed in NAD HEENT: head is normocephalic, atraumatic. Mouth is pink and moist Heart: Palpable radial and pedal pulses bilaterally Lungs: Respiratory effort nonlabored Abd: soft, ND, +BS, mild expected postoperative tenderness without rebound or guarding. Incisions with steri strips intact - no erythema or discharge MSK: all 4 extremities are symmetrical with no cyanosis, clubbing, or edema. Skin: warm and dry with no masses, lesions, or rashes Psych: A&Ox3 with an appropriate affect.    Lab Results:  Recent Labs    06/06/21 0106 06/07/21 0025  WBC 9.5 8.5  HGB 15.0 13.7  HCT 45.3 41.1  PLT 161 216    BMET Recent Labs    06/06/21 0415 06/07/21 0025  NA 134* 134*  K 4.2 4.1  CL 97* 95*  CO2 27 26  GLUCOSE 169* 197*  BUN 15 18  CREATININE 1.10 1.01  CALCIUM 9.0 8.6*    PT/INR No results for input(s): LABPROT, INR in the last 72 hours. CMP     Component Value Date/Time   NA 134 (L) 06/07/2021 0025   K 4.1 06/07/2021 0025   CL 95 (L) 06/07/2021 0025   CO2 26 06/07/2021 0025   GLUCOSE 197 (H) 06/07/2021 0025   BUN 18 06/07/2021 0025   CREATININE 1.01 06/07/2021 0025   CALCIUM 8.6 (L) 06/07/2021 0025   PROT 6.0 (L)  06/07/2021 0025   ALBUMIN 2.3 (L) 06/07/2021 0025   AST 59 (H) 06/07/2021 0025   ALT 132 (H) 06/07/2021 0025   ALKPHOS 214 (H) 06/07/2021 0025   BILITOT 2.5 (H) 06/07/2021 0025   GFRNONAA >60 06/07/2021 0025   GFRAA >60 08/31/2017 0758   Lipase     Component Value Date/Time   LIPASE 27 06/04/2021 0032       Studies/Results: DG Cholangiogram Operative  Result Date: 06/05/2021 CLINICAL DATA:  Lap chole with IOC. EXAM: INTRAOPERATIVE CHOLANGIOGRAM COMPARISON:  CT abdomen, MRI abdomen and ultrasound abdomen, 06/04/2021. FLUOROSCOPY TIME:  Fluoroscopy Time:  19 seconds Radiation Exposure Index (if provided by the fluoroscopic device): 12 mGy Number of Acquired Spot Images: 45, submitted FINDINGS: Multiple, oblique planar images of the RIGHT upper quadrant demonstrating instrumentation, cystic duct cannulation and antegrade cholangiogram. No intra- or extrahepatic biliary dilatation. No discrete filling defect identified. Cholecystectomy clips. IMPRESSION: Intra procedural fluoroscopic imaging for IOC. No discrete biliary filling defects identified. Please see performing service dictation for complete description of intra procedural findings. Electronically Signed   By: Michaelle Birks M.D.   On: 06/05/2021 11:24   DG ERCP  Result Date: 06/06/2021 CLINICAL DATA:  Acute cholecystitis EXAM: ERCP TECHNIQUE: Multiple spot images obtained with the fluoroscopic device and submitted for interpretation post-procedure. COMPARISON:  Internal cholangiogram from the previous day FINDINGS: A series of fluoroscopic spot images  document endoscopic cannulation and opacification of the CBD and biliary tree with subsequent passage of balloon tipped catheter through the CBD. There is incomplete opacification of the peripheral biliary tree, which appears mildly distended centrally. No evidence of extravasation. Cholecystectomy clips noted. IMPRESSION: Endoscopic CBD cannulation and intervention as above. These images were  submitted for radiologic interpretation only. Please see the procedural report for the amount of contrast and the fluoroscopy time utilized. Electronically Signed   By: Lucrezia Europe M.D.   On: 06/06/2021 15:44    Anti-infectives: Anti-infectives (From admission, onward)    Start     Dose/Rate Route Frequency Ordered Stop   06/04/21 1400  piperacillin-tazobactam (ZOSYN) IVPB 3.375 g        3.375 g 12.5 mL/hr over 240 Minutes Intravenous Every 8 hours 06/04/21 0519     06/04/21 0445  piperacillin-tazobactam (ZOSYN) IVPB 3.375 g        3.375 g 100 mL/hr over 30 Minutes Intravenous  Once 06/04/21 0435 06/04/21 0538        Assessment/Plan Acute cholecystitis - POD2 s/p lap chole with IOC - Dr Georgette Dover 9/7. Positive IOC - successful ERCP yesterday - 9/8 - LFTs improving s/p ERCP with T bil 2.5 (3.5) - encouraged ambulation - scheduled tylenol for pain control. Offered to send prescription pain medication which he politely refuses has he does not like the side effects  Stable for discharge from our standpoint  FEN: heart healthy ID: [id number] 9/5>. No further abx needed from general surgery perspective VTE: SCDs. lovenox  T2DM HTN    LOS: 2 days    Winferd Humphrey, [facility] Surgery 06/07/2021, 9:31 AM Please see Amion for pager number during day hours 7:00am-4:30pm

## 2021-06-07 NOTE — Anesthesia Postprocedure Evaluation (Signed)
Anesthesia Post Note  Patient: Chad Oneill  Procedure(s) Performed: ENDOSCOPIC RETROGRADE CHOLANGIOPANCREATOGRAPHY (ERCP) WITH PROPOFOL SPHINCTEROTOMY REMOVAL OF STONES     Patient location during evaluation: PACU Anesthesia Type: General Level of consciousness: awake and alert Pain management: pain level controlled Vital Signs Assessment: post-procedure vital signs reviewed and stable Respiratory status: spontaneous breathing, nonlabored ventilation, respiratory function stable and patient connected to nasal cannula oxygen Cardiovascular status: blood pressure returned to baseline and stable Postop Assessment: no apparent nausea or vomiting Anesthetic complications: no   No notable events documented.  Last Vitals:  Vitals:   06/06/21 2344 06/07/21 0407  BP: (!) 143/92 (!) 150/99  Pulse: 80 72  Resp: 19 20  Temp: 36.7 C 36.5 C  SpO2: 93% 93%    Last Pain:  Vitals:   06/07/21 0407  TempSrc: Oral  PainSc:                  [name] S

## 2021-06-19 ENCOUNTER — Ambulatory Visit
Admission: RE | Admit: 2021-06-19 | Discharge: 2021-06-19 | Disposition: A | Discharge status: Home / Self Care | Payer: Medicare Other | Source: Ambulatory Visit | Attending: Student | Admitting: Student

## 2021-06-19 ENCOUNTER — Other Ambulatory Visit: Payer: Self-pay | Admitting: Student

## 2021-06-19 DIAGNOSIS — R051 Acute cough: Secondary | ICD-10-CM

## 2021-06-21 ENCOUNTER — Encounter (HOSPITAL_COMMUNITY): Payer: Self-pay | Admitting: Surgery

## 2021-07-17 ENCOUNTER — Telehealth: Payer: Self-pay

## 2021-07-17 NOTE — Telephone Encounter (Signed)
error 

## 2021-07-31 ENCOUNTER — Ambulatory Visit: Payer: Medicare Other | Admitting: Gastroenterology

## 2021-09-03 ENCOUNTER — Ambulatory Visit: Payer: Medicare Other | Admitting: Internal Medicine

## 2022-10-14 IMAGING — CR DG CHEST 2V
2 series · 2 of 2 positions shown · non-contrast
Comparison: 08/31/2017.

CLINICAL DATA: Elevated white count and cough. Laparoscopic
cholecystectomy 06/05/2021 and ERCP 06/06/2021.

EXAM:
CHEST - 2 VIEW

[w chest pa]
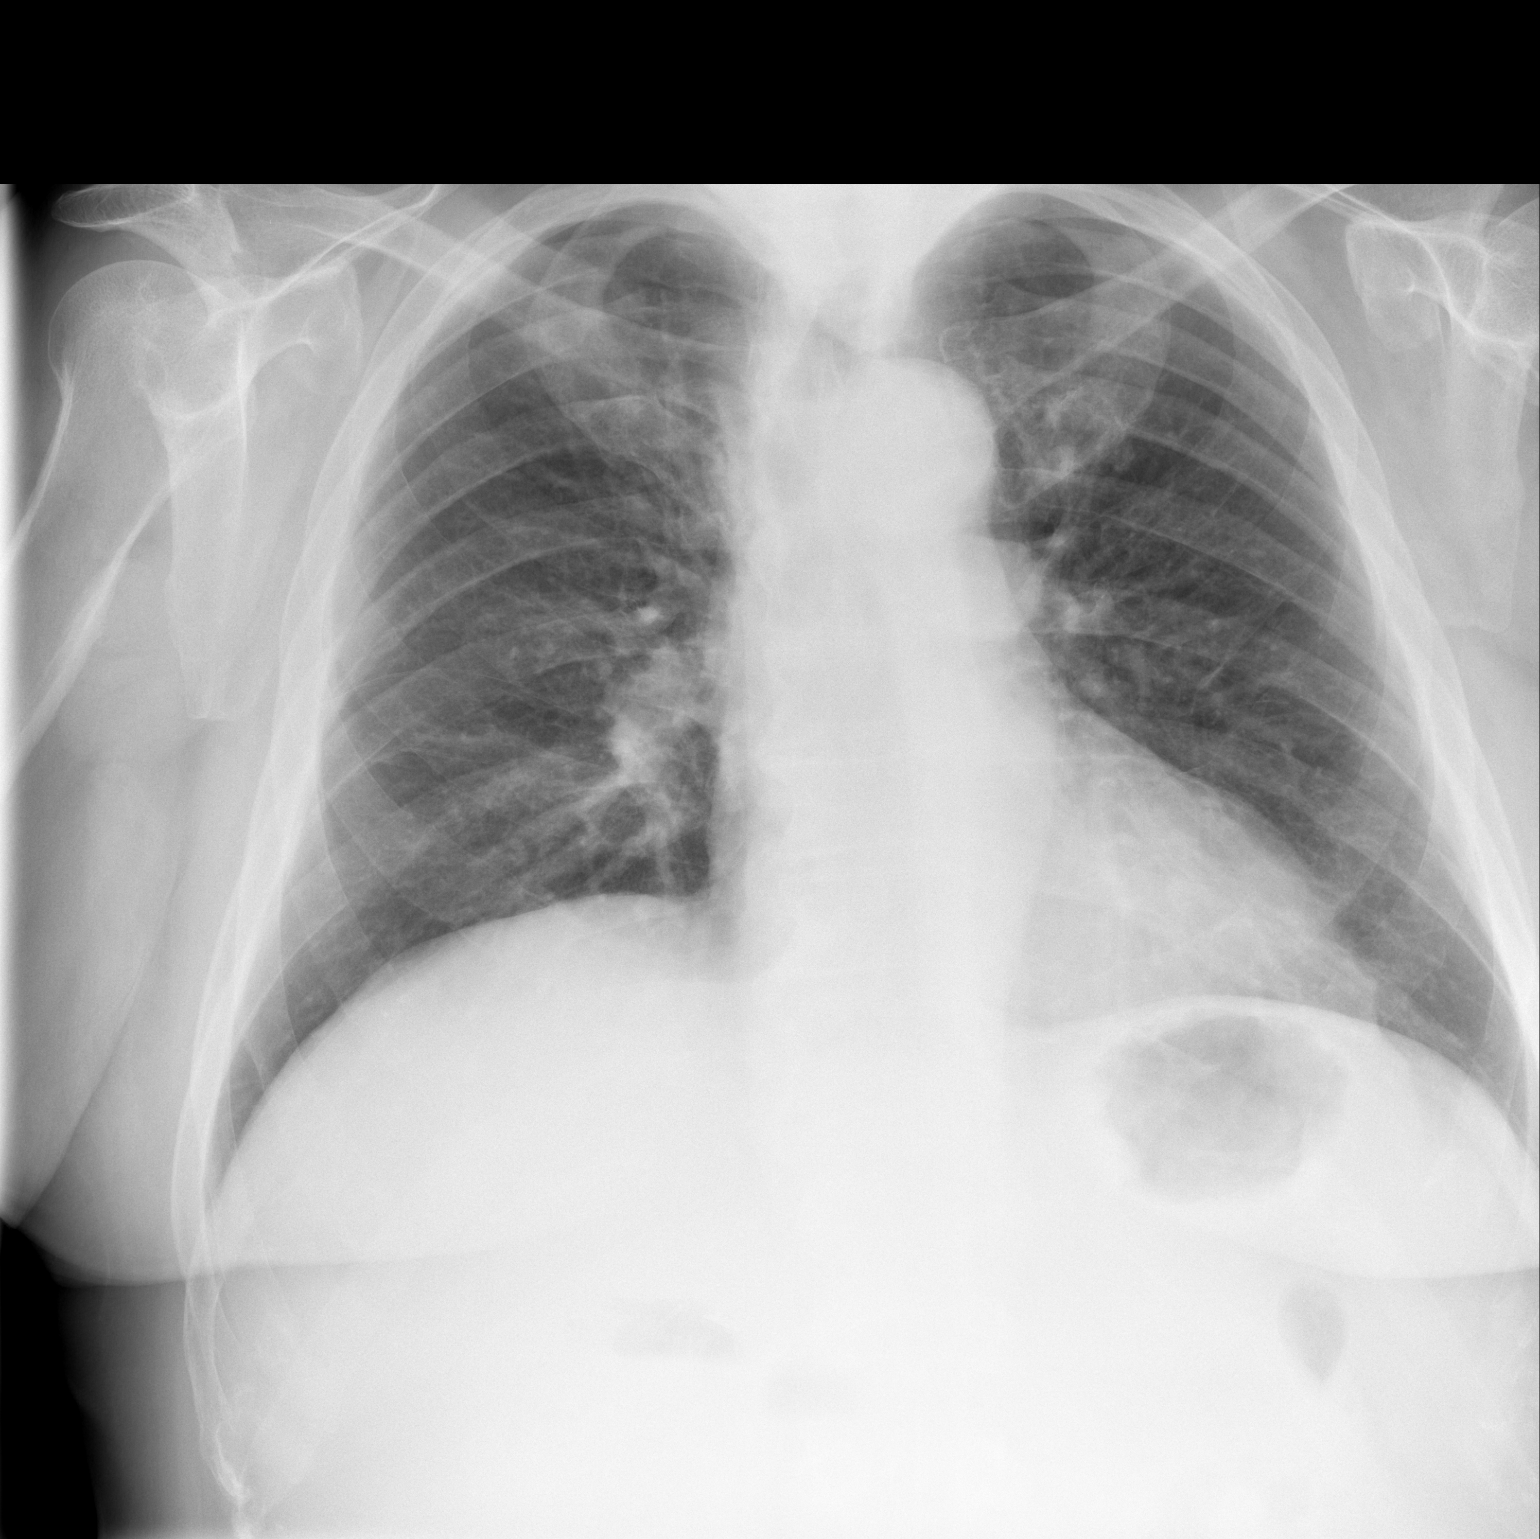

[w chest lat]
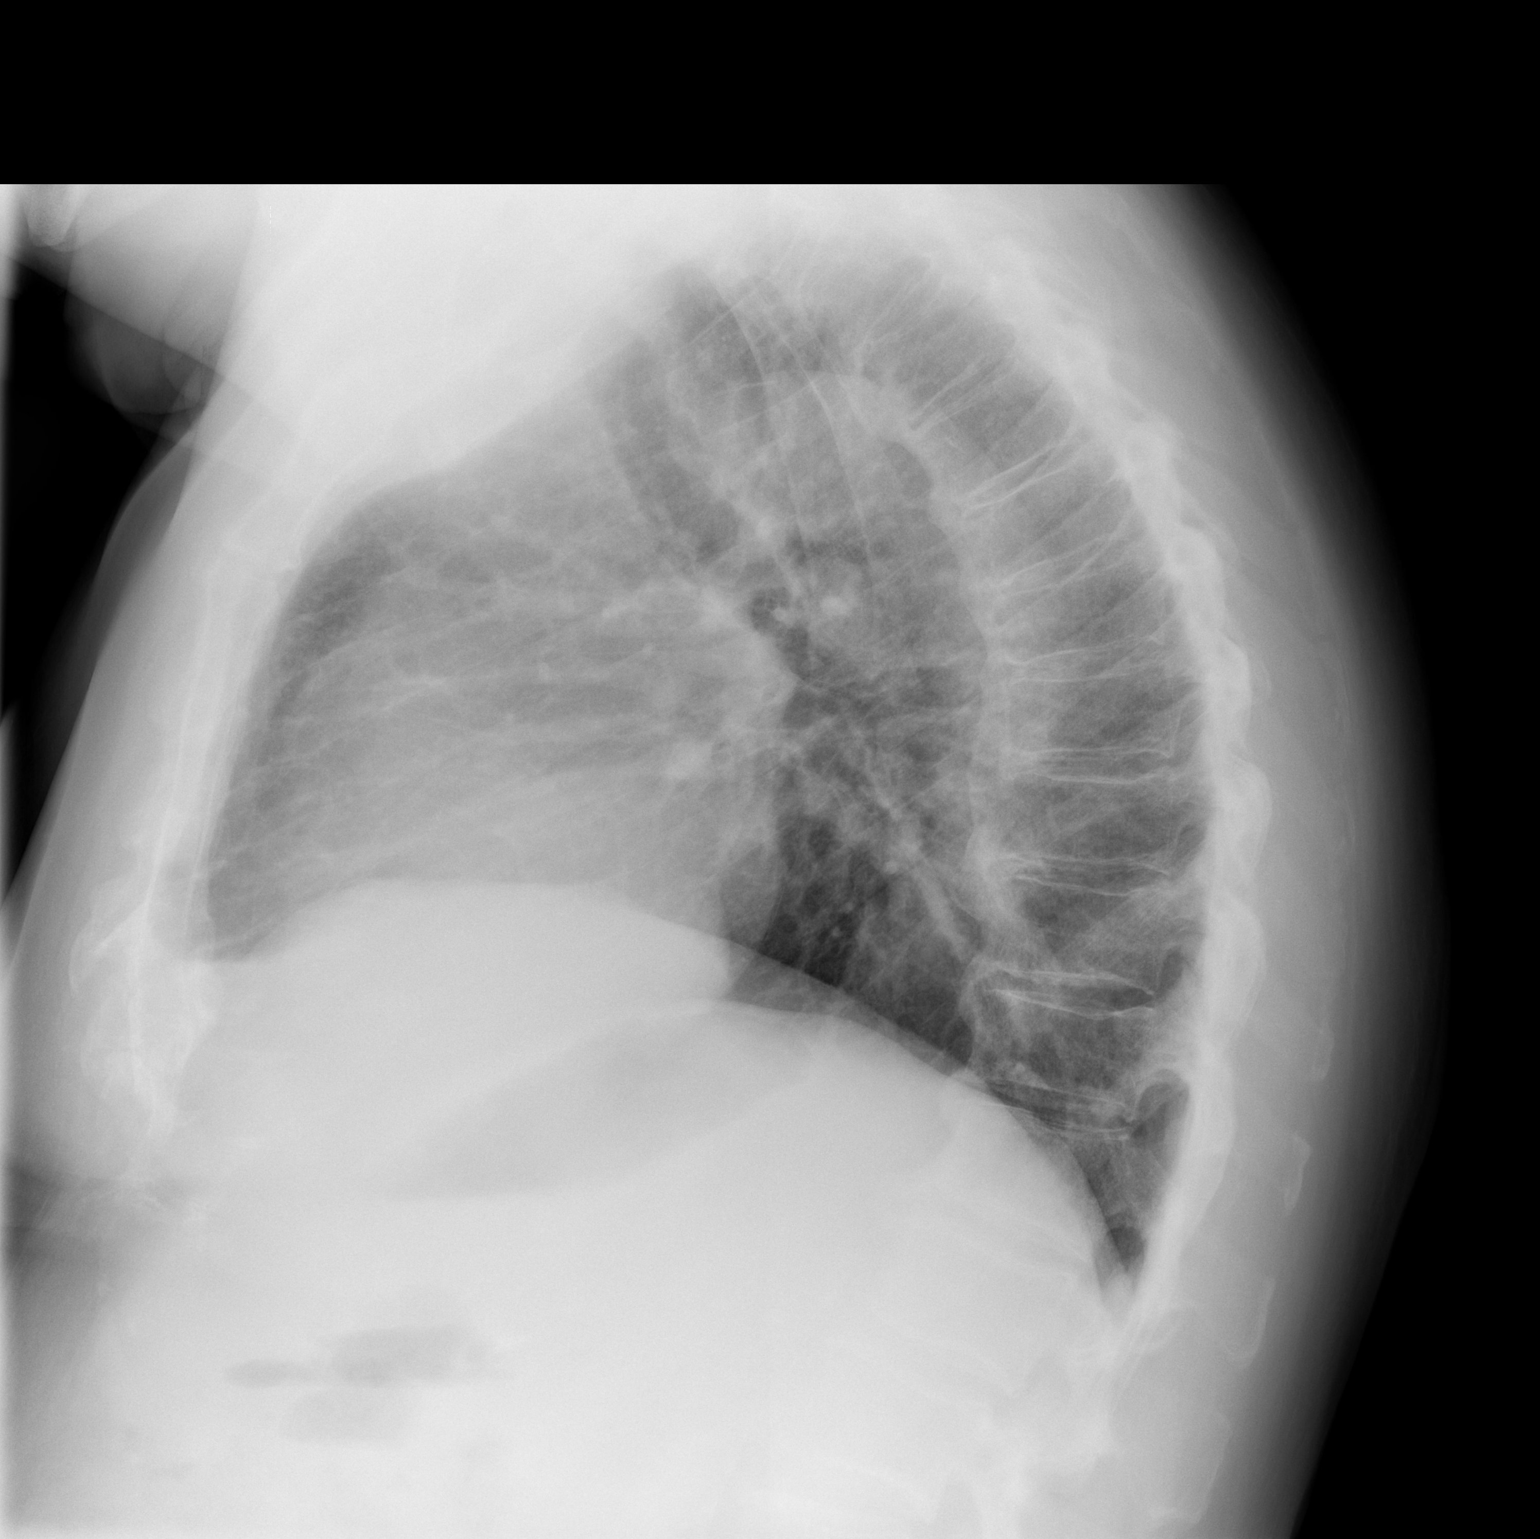

[2 of 2 positions shown; findings below may reference images not displayed]

FINDINGS: Heart size is exaggerated by low lung volumes. Minimal atelectasis
is present the bases. No significant airspace consolidation is
present. No edema or effusion is present. Fused anterior osteophytes
and exaggerated thoracic kyphosis are stable.
IMPRESSION: Low lung volumes and mild bibasilar atelectasis.
